# Patient Record
Sex: Female | Born: 1975 | Race: Black or African American | Hispanic: No | Marital: Married | Smoking: Former smoker
Health system: Southern US, Community
[De-identification: ages and names within clinical notes are randomized; demographics above are authoritative.]

## PROBLEM LIST (undated history)

## (undated) DIAGNOSIS — R55 Syncope and collapse: Secondary | ICD-10-CM

## (undated) DIAGNOSIS — F419 Anxiety disorder, unspecified: Secondary | ICD-10-CM

## (undated) DIAGNOSIS — L309 Dermatitis, unspecified: Secondary | ICD-10-CM

## (undated) DIAGNOSIS — D649 Anemia, unspecified: Secondary | ICD-10-CM

## (undated) DIAGNOSIS — I10 Essential (primary) hypertension: Secondary | ICD-10-CM

## (undated) HISTORY — DX: Dermatitis, unspecified: L30.9

## (undated) HISTORY — DX: Anxiety disorder, unspecified: F41.9

## (undated) HISTORY — PX: TUBAL LIGATION: SHX77

---

## 2004-06-05 ENCOUNTER — Emergency Department (HOSPITAL_COMMUNITY): Admission: EM | Admit: 2004-06-05 | Discharge: 2004-06-05 | Payer: Self-pay | Admitting: *Deleted

## 2005-08-31 ENCOUNTER — Observation Stay: Payer: Self-pay | Admitting: Obstetrics and Gynecology

## 2005-10-31 ENCOUNTER — Observation Stay: Payer: Self-pay | Admitting: Certified Nurse Midwife

## 2005-12-06 ENCOUNTER — Inpatient Hospital Stay: Payer: Self-pay | Admitting: Obstetrics and Gynecology

## 2010-05-20 ENCOUNTER — Emergency Department: Payer: Self-pay | Admitting: Emergency Medicine

## 2010-05-25 ENCOUNTER — Ambulatory Visit: Payer: Self-pay | Admitting: Cardiovascular Disease

## 2010-06-09 ENCOUNTER — Ambulatory Visit: Payer: Self-pay | Admitting: Cardiovascular Disease

## 2013-03-18 ENCOUNTER — Emergency Department: Payer: Self-pay

## 2013-03-18 LAB — MAGNESIUM: Magnesium: 1.8 mg/dL

## 2013-03-18 LAB — CBC
HCT: 31.6 % — ABNORMAL LOW (ref 35.0–47.0)
MCH: 22.9 pg — ABNORMAL LOW (ref 26.0–34.0)
MCHC: 31.8 g/dL — ABNORMAL LOW (ref 32.0–36.0)
MCV: 72 fL — ABNORMAL LOW (ref 80–100)
Platelet: 329 10*3/uL (ref 150–440)
RBC: 4.38 10*6/uL (ref 3.80–5.20)

## 2013-03-18 LAB — COMPREHENSIVE METABOLIC PANEL
Albumin: 3 g/dL — ABNORMAL LOW (ref 3.4–5.0)
Alkaline Phosphatase: 59 U/L
Anion Gap: 6 — ABNORMAL LOW (ref 7–16)
BUN: 6 mg/dL — ABNORMAL LOW (ref 7–18)
Chloride: 105 mmol/L (ref 98–107)
Co2: 27 mmol/L (ref 21–32)
Creatinine: 0.8 mg/dL (ref 0.60–1.30)
EGFR (Non-African Amer.): >60
SGOT(AST): 27 U/L (ref 15–37)
SGPT (ALT): 19 U/L (ref 12–78)
Sodium: 138 mmol/L (ref 136–145)
Total Protein: 7 g/dL (ref 6.4–8.2)

## 2013-03-18 LAB — LIPASE, BLOOD: Lipase: 66 U/L — ABNORMAL LOW (ref 73–393)

## 2014-01-23 DIAGNOSIS — R55 Syncope and collapse: Secondary | ICD-10-CM

## 2014-01-23 HISTORY — DX: Syncope and collapse: R55

## 2014-06-01 ENCOUNTER — Encounter (HOSPITAL_COMMUNITY): Payer: Self-pay | Admitting: Emergency Medicine

## 2014-06-01 ENCOUNTER — Emergency Department (HOSPITAL_COMMUNITY): Payer: Medicaid Other

## 2014-06-01 ENCOUNTER — Emergency Department (HOSPITAL_COMMUNITY)
Admission: EM | Admit: 2014-06-01 | Discharge: 2014-06-02 | Disposition: A | Discharge status: Home / Self Care | Payer: Medicaid Other | Attending: Emergency Medicine | Admitting: Emergency Medicine

## 2014-06-01 DIAGNOSIS — Z3202 Encounter for pregnancy test, result negative: Secondary | ICD-10-CM | POA: Diagnosis not present

## 2014-06-01 DIAGNOSIS — R102 Pelvic and perineal pain: Secondary | ICD-10-CM

## 2014-06-01 DIAGNOSIS — Z9851 Tubal ligation status: Secondary | ICD-10-CM | POA: Diagnosis not present

## 2014-06-01 DIAGNOSIS — R1032 Left lower quadrant pain: Secondary | ICD-10-CM

## 2014-06-01 DIAGNOSIS — N921 Excessive and frequent menstruation with irregular cycle: Secondary | ICD-10-CM

## 2014-06-01 DIAGNOSIS — I10 Essential (primary) hypertension: Secondary | ICD-10-CM | POA: Diagnosis not present

## 2014-06-01 DIAGNOSIS — N938 Other specified abnormal uterine and vaginal bleeding: Secondary | ICD-10-CM | POA: Diagnosis present

## 2014-06-01 DIAGNOSIS — N939 Abnormal uterine and vaginal bleeding, unspecified: Secondary | ICD-10-CM

## 2014-06-01 DIAGNOSIS — N920 Excessive and frequent menstruation with regular cycle: Secondary | ICD-10-CM | POA: Insufficient documentation

## 2014-06-01 HISTORY — DX: Essential (primary) hypertension: I10

## 2014-06-01 LAB — CBC WITH DIFFERENTIAL/PLATELET
Basophils Absolute: 0 10*3/uL (ref 0.0–0.1)
Basophils Relative: 0 % (ref 0–1)
EOS PCT: 2 % (ref 0–5)
Eosinophils Absolute: 0.2 10*3/uL (ref 0.0–0.7)
HCT: 25.7 % — ABNORMAL LOW (ref 36.0–46.0)
Hemoglobin: 7.7 g/dL — ABNORMAL LOW (ref 12.0–15.0)
LYMPHS ABS: 2.5 10*3/uL (ref 0.7–4.0)
Lymphocytes Relative: 24 % (ref 12–46)
MCH: 21.4 pg — AB (ref 26.0–34.0)
MCHC: 30 g/dL (ref 30.0–36.0)
MCV: 71.6 fL — ABNORMAL LOW (ref 78.0–100.0)
MONO ABS: 0.6 10*3/uL (ref 0.1–1.0)
MONOS PCT: 6 % (ref 3–12)
NEUTROS PCT: 68 % (ref 43–77)
Neutro Abs: 7 10*3/uL (ref 1.7–7.7)
Platelets: 383 10*3/uL (ref 150–400)
RBC: 3.59 MIL/uL — ABNORMAL LOW (ref 3.87–5.11)
RDW: 16.4 % — ABNORMAL HIGH (ref 11.5–15.5)
WBC: 10.3 10*3/uL (ref 4.0–10.5)

## 2014-06-01 LAB — COMPREHENSIVE METABOLIC PANEL
ALBUMIN: 3 g/dL — AB (ref 3.5–5.2)
ALK PHOS: 44 U/L (ref 39–117)
ALT: 12 U/L (ref 0–35)
ANION GAP: 4 — AB (ref 5–15)
AST: 20 U/L (ref 0–37)
BUN: 10 mg/dL (ref 6–23)
CALCIUM: 8.3 mg/dL — AB (ref 8.4–10.5)
CO2: 28 mmol/L (ref 19–32)
CREATININE: 1.01 mg/dL (ref 0.50–1.10)
Chloride: 105 mmol/L (ref 96–112)
GFR, EST AFRICAN AMERICAN: 80 mL/min — AB (ref >90)
GFR, EST NON AFRICAN AMERICAN: 69 mL/min — AB (ref >90)
Glucose, Bld: 140 mg/dL — ABNORMAL HIGH (ref 70–99)
POTASSIUM: 3.3 mmol/L — AB (ref 3.5–5.1)
SODIUM: 137 mmol/L (ref 135–145)
TOTAL PROTEIN: 7 g/dL (ref 6.0–8.3)
Total Bilirubin: 0.3 mg/dL (ref 0.3–1.2)

## 2014-06-01 LAB — I-STAT BETA HCG BLOOD, ED (MC, WL, AP ONLY)

## 2014-06-01 LAB — LIPASE, BLOOD: Lipase: 20 U/L (ref 11–59)

## 2014-06-01 MED ORDER — SODIUM CHLORIDE 0.9 % IV BOLUS (SEPSIS)
1000.0000 mL | Freq: Once | INTRAVENOUS | Status: AC
  Administered 2014-06-01: 1000 mL via INTRAVENOUS

## 2014-06-01 MED ORDER — ONDANSETRON HCL 4 MG PO TABS: 4.0000 mg | ORAL_TABLET | Freq: Four times a day (QID) | ORAL | Status: DC

## 2014-06-01 MED ORDER — HYDROMORPHONE HCL 1 MG/ML IJ SOLN
1.0000 mg | Freq: Once | INTRAMUSCULAR | Status: AC
  Administered 2014-06-01: 1 mg via INTRAVENOUS
  Filled 2014-06-01: qty 1

## 2014-06-01 MED ORDER — OXYCODONE-ACETAMINOPHEN 5-325 MG PO TABS: 1.0000 | ORAL_TABLET | Freq: Four times a day (QID) | ORAL | Status: DC | PRN

## 2014-06-01 MED ORDER — MEGESTROL ACETATE 40 MG PO TABS: 40.0000 mg | ORAL_TABLET | Freq: Every day | ORAL | Status: DC

## 2014-06-01 MED ORDER — ONDANSETRON HCL 4 MG/2ML IJ SOLN
4.0000 mg | Freq: Once | INTRAMUSCULAR | Status: AC
  Administered 2014-06-01: 4 mg via INTRAVENOUS
  Filled 2014-06-01: qty 2

## 2014-06-01 NOTE — ED Provider Notes (Signed)
CSN: [id number]     Arrival date & time 06/01/14  2054 History   First MD Initiated Contact with Patient 06/01/14 2106     Chief Complaint  Patient presents with  . Abdominal Pain  . Vaginal Bleeding     Patient is a 39 y.o. female presenting with abdominal pain and vaginal bleeding. The history is provided by the patient. No language interpreter was used.  Abdominal Pain Associated symptoms: vaginal bleeding   Vaginal Bleeding Associated symptoms: abdominal pain    Kathleen Rush presents for evaluation of abdominal pain and vomiting. She reports 2 weeks of left lower quadrant abdominal pain. The pain is described as sharp and constant nature. The pain is nonradiating. She reports it feels worse at night. She also reports 2 weeks of vomiting, emesis approximately 2 times per day. She has associated constipation, her last bowel movement was 2 days ago. She is passing gas without difficulty. She denies any fevers, chest pain, shortness of breath, leg swelling or pain, dysuria. She states she is having heavy menstrual cycle at this time and passing clots, which is unusual for her. Her last menstrual period was January 14. Symptoms are moderate, constant, worsening.  Past Medical History  Diagnosis Date  . Hypertension    Past Surgical History  Procedure Laterality Date  . Cesarean section    . Tubal ligation     No family history on file. History  Substance Use Topics  . Smoking status: Not on file  . Smokeless tobacco: Not on file  . Alcohol Use: Not on file   OB History    No data available     Review of Systems  Gastrointestinal: Positive for abdominal pain.  Genitourinary: Positive for vaginal bleeding.  All other systems reviewed and are negative.     Allergies  Review of patient's allergies indicates no known allergies.  Home Medications   Prior to Admission medications   Not on File   BP 149/64 mmHg  Pulse 72  Temp(Src) 97.6 F (36.4 C) (Oral)  Resp 18  Ht  5' (1.524 m)  Wt 220 lb (99.791 kg)  BMI 42.97 kg/m2  SpO2 100%  LMP 06/01/2014 (Exact Date) Physical Exam  Constitutional: She is oriented to person, place, and time. She appears well-developed and well-nourished.  HENT:  Head: Normocephalic and atraumatic.  Cardiovascular: Normal rate and regular rhythm.   No murmur heard. Pulmonary/Chest: Effort normal and breath sounds normal. No respiratory distress.  Abdominal: Soft. There is no rebound and no guarding.  Mild to moderate left mid abdominal tenderness without guarding or rebound  Genitourinary:  Os closed, moderate blood in vaginal vault, no clots.  No re-pooling of blood after clearing.    Musculoskeletal: She exhibits no edema or tenderness.  Neurological: She is alert and oriented to person, place, and time.  Skin: Skin is warm and dry.  Psychiatric: She has a normal mood and affect. Her behavior is normal.  Nursing note and vitals reviewed.   ED Course  Procedures (including critical care time) Labs Review Labs Reviewed  CBC WITH DIFFERENTIAL/PLATELET - Abnormal; Notable for the following:    RBC 3.59 (*)    Hemoglobin 7.7 (*)    HCT 25.7 (*)    MCV 71.6 (*)    MCH 21.4 (*)    RDW 16.4 (*)    All other components within normal limits  COMPREHENSIVE METABOLIC PANEL - Abnormal; Notable for the following:    Potassium 3.3 (*)  Glucose, Bld 140 (*)    Calcium 8.3 (*)    Albumin 3.0 (*)    GFR calc non Af Amer 69 (*)    GFR calc Af Amer 80 (*)    Anion gap 4 (*)    All other components within normal limits  LIPASE, BLOOD  URINALYSIS, ROUTINE W REFLEX MICROSCOPIC  POC URINE PREG, ED  I-STAT BETA HCG BLOOD, ED ([facility], [facility], [facility] ONLY)  GC/CHLAMYDIA PROBE AMP ([facility])    Imaging Review No results found.   EKG Interpretation None      MDM   Final diagnoses:  Menorrhagia with irregular cycle    Patient here for evaluation of vaginal bleeding. Patient does have some bleeding on exam, but by history this  is improving. There is no recurrent bleeding during exam. Patient is anemic on her labs. She states she has a history of anemia and is supposed to take iron supplements. It is unclear what her baseline hemoglobin as. During emergency Department evaluation patient has been hemodynamically stable. Discussed the case with OB/GYN on-call, plan to start Megace with close outpatient follow-up and return precautions. Pelvic ultrasound pending given left lower quadrant pain on history and exam. Presentation is not consistent with diverticulitis or bowel obstruction.    Tilden FossaElizabeth Grove Defina, MD 06/02/14 [phone]

## 2014-06-01 NOTE — Discharge Instructions (Signed)
It is very important to follow up with your OBGYN in the next day for recheck.  Restart your iron supplements.  Your Hemoglobin today was 7.7, which is very low.    Abnormal Uterine Bleeding Abnormal uterine bleeding can affect women at various stages in life, including teenagers, women in their reproductive years, pregnant women, and women who have reached menopause. Several kinds of uterine bleeding are considered abnormal, including:  Bleeding or spotting between periods.   Bleeding after sexual intercourse.   Bleeding that is heavier or more than normal.   Periods that last longer than usual.  Bleeding after menopause.  Many cases of abnormal uterine bleeding are minor and simple to treat, while others are more serious. Any type of abnormal bleeding should be evaluated by your health care provider. Treatment will depend on the cause of the bleeding. HOME CARE INSTRUCTIONS Monitor your condition for any changes. The following actions may help to alleviate any discomfort you are experiencing:  Avoid the use of tampons and douches as directed by your health care provider.  Change your pads frequently. You should get regular pelvic exams and Pap tests. Keep all follow-up appointments for diagnostic tests as directed by your health care provider.  SEEK MEDICAL CARE IF:   Your bleeding lasts more than 1 week.   You feel dizzy at times.  SEEK IMMEDIATE MEDICAL CARE IF:   You pass out.   You are changing pads every 15 to 30 minutes.   You have abdominal pain.  You have a fever.   You become sweaty or weak.   You are passing large blood clots from the vagina.   You start to feel nauseous and vomit. MAKE SURE YOU:   Understand these instructions.  Will watch your condition.  Will get help right away if you are not doing well or get worse. Document Released: 04/11/2005 Document Revised: 04/16/2013 Document Reviewed: 11/08/2012 [facility] Patient Information 2015  [employer] [place]. This information is not intended to replace advice given to you by your health care provider. Make sure you discuss any questions you have with your health care provider.

## 2014-06-01 NOTE — ED Notes (Signed)
Patient here with complaint of LLQ abdominal pain. States that it has been present for about 2 weeks. States that it is much worse when she eats eggs. States pain has been constant since eating eggs 2 weeks ago. States that she continues to eat eggs as recently as today. Vomiting occurs as well after consumption.

## 2014-06-01 NOTE — ED Notes (Signed)
Additionally reports vaginal bleeding with lots of clots being passed. States she feels like she may be pregnant, but has had tubal ligation.

## 2014-06-02 ENCOUNTER — Telehealth: Payer: Self-pay | Admitting: *Deleted

## 2014-06-02 LAB — GC/CHLAMYDIA PROBE AMP (~~LOC~~) NOT AT ARMC
CHLAMYDIA, DNA PROBE: NEGATIVE
NEISSERIA GONORRHEA: NEGATIVE

## 2014-06-02 MED ORDER — OXYCODONE-ACETAMINOPHEN 5-325 MG PO TABS
2.0000 | ORAL_TABLET | Freq: Once | ORAL | Status: AC
  Administered 2014-06-02: 2 via ORAL
  Filled 2014-06-02: qty 2

## 2014-06-02 NOTE — ED Provider Notes (Signed)
1:00 AM  Assumed care from Dr. Madilyn Hookees.  Pt is a 39 y.o. female who presents the emergency department with 2 weeks of left lower quadrant pain and heavy vaginal bleeding for the past 2-3 days. Bleeding his early improved. Hemoglobin is 7.7 but suspect this is chronic. No hemodynamic instability. Abdominal exam benign. Pelvic ultrasound was pending but is unremarkable. We'll discharge home with prescription for Percocet, Zofran as well as Megace taper. We'll give OB/GYN follow-up information. Patient appears very comfortable eating crackers and drinking ginger ale in the room. In no distress.  Kathleen MawKristen N Ward, DO 06/02/14 0104

## 2014-06-02 NOTE — Telephone Encounter (Signed)
Kathleen Rush called and left a message requesting results of std tests.  Per chart review [name] and is referred to our clinic 06/23/14. Called [name] and explained I can give her results of STD tests- GC/Chlam which were negative , but any other blood tests she will either need to wait and discuss with the doctor at her visit her 06[facility]23/14 or [name] since those are not our typical tests. I reviewed with her the appointmetn date /time and our location. She voices understanding.

## 2014-06-04 ENCOUNTER — Telehealth (HOSPITAL_BASED_OUTPATIENT_CLINIC_OR_DEPARTMENT_OTHER): Payer: Self-pay | Admitting: Emergency Medicine

## 2014-06-23 ENCOUNTER — Ambulatory Visit (INDEPENDENT_AMBULATORY_CARE_PROVIDER_SITE_OTHER): Payer: Medicaid Other | Admitting: Obstetrics & Gynecology

## 2014-06-23 ENCOUNTER — Encounter: Payer: Self-pay | Admitting: Obstetrics & Gynecology

## 2014-06-23 VITALS — BP 142/87 | HR 70 | Temp 97.8°F | Ht 63.25 in | Wt 224.5 lb

## 2014-06-23 DIAGNOSIS — N920 Excessive and frequent menstruation with regular cycle: Secondary | ICD-10-CM

## 2014-06-23 DIAGNOSIS — D638 Anemia in other chronic diseases classified elsewhere: Secondary | ICD-10-CM

## 2014-06-23 DIAGNOSIS — Z30430 Encounter for insertion of intrauterine contraceptive device: Secondary | ICD-10-CM

## 2014-06-23 LAB — POCT PREGNANCY, URINE: PREG TEST UR: NEGATIVE

## 2014-06-23 MED ORDER — IBUPROFEN 800 MG PO TABS: 800.0000 mg | ORAL_TABLET | Freq: Three times a day (TID) | ORAL | Status: DC | PRN

## 2014-06-23 MED ORDER — MEGESTROL ACETATE 40 MG PO TABS: 40.0000 mg | ORAL_TABLET | Freq: Every day | ORAL | Status: DC

## 2014-06-23 MED ORDER — LEVONORGESTREL 20 MCG/24HR IU IUD
INTRAUTERINE_SYSTEM | Freq: Once | INTRAUTERINE | Status: AC
  Administered 2014-06-23: 1 via INTRAUTERINE

## 2014-06-23 MED ORDER — VITAMIN C 500 MG PO TABS: 500.0000 mg | ORAL_TABLET | Freq: Every day | ORAL | Status: DC

## 2014-06-23 NOTE — Progress Notes (Signed)
Subjective:     Patient ID: Kathleen Rush, female   DOB: 1976-04-15, 39 y.o.   MRN: [id number]  HPI Pt c/o painful menses and heavy menses.  She denies intermenstral bleeding.  Her bleeding has led to anemia.   Review of Systems     Objective:   Physical Exam BP 142/87 mmHg  Pulse 70  Temp(Src) 97.8 F (36.6 C) (Oral)  Ht 5' 3.25" (1.607 m)  Wt 224 lb 8 oz (101.833 kg)  BMI 39.43 kg/m2  LMP 06/01/2014 (Exact Date)  GYNECOLOGY CLINIC PROCEDURE NOTE  IUD Insertion Procedure Note Patient identified, informed consent performed.  Discussed risks of irregular bleeding, cramping, infection, malpositioning or misplacement of the IUD outside the uterus which may require further procedures. Time out was performed.  Urine pregnancy test negative.  Speculum placed in the vagina.  Cervix visualized.  Cleaned with Betadine x 2.  Grasped anteriorly with a single tooth tenaculum.  Uterus sounded to 10 cm.  Mirena IUD placed per manufacturer's recommendations.  Strings trimmed to 3 cm. Tenaculum was removed, good hemostasis noted.  Patient tolerated procedure well.    CBC    Component Value Date/Time   WBC 10.3 06/01/2014 2109   RBC 3.59* 06/01/2014 2109   HGB 7.7* 06/01/2014 2109   HCT 25.7* 06/01/2014 2109   PLT 383 06/01/2014 2109   MCV 71.6* 06/01/2014 2109   MCH 21.4* 06/01/2014 2109   MCHC 30.0 06/01/2014 2109   RDW 16.4* 06/01/2014 2109   LYMPHSABS 2.5 06/01/2014 2109   MONOABS 0.6 06/01/2014 2109   EOSABS 0.2 06/01/2014 2109   BASOSABS 0.0 06/01/2014 2109       06/02/2014 CLINICAL DATA: Left lower quadrant pain, nausea, and vaginal bleeding.  EXAM: TRANSABDOMINAL AND TRANSVAGINAL ULTRASOUND OF PELVIS  TECHNIQUE: Both transabdominal and transvaginal ultrasound examinations of the pelvis were performed. Transabdominal technique was performed for global imaging of the pelvis including uterus, ovaries, adnexal regions, and pelvic cul-de-sac. It was necessary to proceed  with endovaginal exam following the transabdominal exam to visualize the ovaries and endometrium.  COMPARISON: None  FINDINGS: Uterus  Measurements: 12.7 x 5.7 x 6.6 cm, anteverted. No fibroids or other mass visualized.  Endometrium  Thickness: 9.5 mm. No focal abnormality visualized.  Right ovary  Measurements: 4.1 x 1.5 x 2 cm. Normal appearance/no adnexal mass.  Left ovary  Measurements: 3.3 x 1.8 x 2 cm. Normal appearance/no adnexal mass.  Other findings  Minimal free fluid in the pelvis.  IMPRESSION: Minimal free fluid in the pelvis is likely physiologic. Uterus and ovaries appear normal.  Assessment:     Menorrhagia and dysmenorrhea  Anemia    Plan:     S/p IUD placement F/u in 4 weeks CBC and TSH Motrin 800mg  q 8 hours prn pain  Patient was given post-procedure instructions.  Patient was also asked to check IUD strings periodically and follow up in 4 weeks for IUD check. FeSO4 bid + vit c 500 mig

## 2014-06-23 NOTE — Patient Instructions (Signed)
Levonorgestrel intrauterine device (IUD) What is this medicine? LEVONORGESTREL IUD (LEE voe nor jes trel) is a contraceptive (birth control) device. The device is placed inside the uterus by a healthcare professional. It is used to prevent pregnancy and can also be used to treat heavy bleeding that occurs during your period. Depending on the device, it can be used for 3 to 5 years. This medicine may be used for other purposes; ask your health care provider or pharmacist if you have questions. COMMON BRAND NAME(S): LILETTA, Mirena, Skyla What should I tell my health care provider before I take this medicine? They need to know if you have any of these conditions: -abnormal Pap smear -cancer of the breast, uterus, or cervix -diabetes -endometritis -genital or pelvic infection now or in the past -have more than one sexual partner or your partner has more than one partner -heart disease -history of an ectopic or tubal pregnancy -immune system problems -IUD in place -liver disease or tumor -problems with blood clots or take blood-thinners -use intravenous drugs -uterus of unusual shape -vaginal bleeding that has not been explained -an unusual or allergic reaction to levonorgestrel, other hormones, silicone, or polyethylene, medicines, foods, dyes, or preservatives -pregnant or trying to get pregnant -breast-feeding How should I use this medicine? This device is placed inside the uterus by a health care professional. Talk to your pediatrician regarding the use of this medicine in children. Special care may be needed. Overdosage: If you think you have taken too much of this medicine contact a poison control center or emergency room at once. NOTE: This medicine is only for you. Do not share this medicine with others. What if I miss a dose? This does not apply. What may interact with this medicine? Do not take this medicine with any of the following  medications: -amprenavir -bosentan -fosamprenavir This medicine may also interact with the following medications: -aprepitant -barbiturate medicines for inducing sleep or treating seizures -bexarotene -griseofulvin -medicines to treat seizures like carbamazepine, ethotoin, felbamate, oxcarbazepine, phenytoin, topiramate -modafinil -pioglitazone -rifabutin -rifampin -rifapentine -some medicines to treat HIV infection like atazanavir, indinavir, lopinavir, nelfinavir, tipranavir, ritonavir -St. John's wort -warfarin This list may not describe all possible interactions. Give your health care provider a list of all the medicines, herbs, non-prescription drugs, or dietary supplements you use. Also tell them if you smoke, drink alcohol, or use illegal drugs. Some items may interact with your medicine. What should I watch for while using this medicine? Visit your doctor or health care professional for regular check ups. See your doctor if you or your partner has sexual contact with others, becomes HIV positive, or gets a sexual transmitted disease. This product does not protect you against HIV infection (AIDS) or other sexually transmitted diseases. You can check the placement of the IUD yourself by reaching up to the top of your vagina with clean fingers to feel the threads. Do not pull on the threads. It is a good habit to check placement after each menstrual period. Call your doctor right away if you feel more of the IUD than just the threads or if you cannot feel the threads at all. The IUD may come out by itself. You may become pregnant if the device comes out. If you notice that the IUD has come out use a backup birth control method like condoms and call your health care provider. Using tampons will not change the position of the IUD and are okay to use during your period. What side effects may   I notice from receiving this medicine? Side effects that you should report to your doctor or  health care professional as soon as possible: -allergic reactions like skin rash, itching or hives, swelling of the face, lips, or tongue -fever, flu-like symptoms -genital sores -high blood pressure -no menstrual period for 6 weeks during use -pain, swelling, warmth in the leg -pelvic pain or tenderness -severe or sudden headache -signs of pregnancy -stomach cramping -sudden shortness of breath -trouble with balance, talking, or walking -unusual vaginal bleeding, discharge -yellowing of the eyes or skin Side effects that usually do not require medical attention (report to your doctor or health care professional if they continue or are bothersome): -acne -breast pain -change in sex drive or performance -changes in weight -cramping, dizziness, or faintness while the device is being inserted -headache -irregular menstrual bleeding within first 3 to 6 months of use -nausea This list may not describe all possible side effects. Call your doctor for medical advice about side effects. You may report side effects to FDA at 1-800-FDA-1088. Where should I keep my medicine? This does not apply. NOTE: This sheet is a summary. It may not cover all possible information. If you have questions about this medicine, talk to your doctor, pharmacist, or health care provider.  2015, Elsevier/Gold Standard. (2011-05-12 13:54:04)  

## 2014-06-24 ENCOUNTER — Telehealth: Payer: Self-pay | Admitting: *Deleted

## 2014-06-24 DIAGNOSIS — D638 Anemia in other chronic diseases classified elsewhere: Secondary | ICD-10-CM

## 2014-06-24 LAB — CBC
HCT: 32 % — ABNORMAL LOW (ref 36.0–46.0)
HEMOGLOBIN: 9.2 g/dL — AB (ref 12.0–15.0)
MCH: 21.7 pg — AB (ref 26.0–34.0)
MCHC: 28.8 g/dL — ABNORMAL LOW (ref 30.0–36.0)
MCV: 75.7 fL — AB (ref 78.0–100.0)
MPV: 9.8 fL (ref 8.6–12.4)
PLATELETS: 438 10*3/uL — AB (ref 150–400)
RBC: 4.23 MIL/uL (ref 3.87–5.11)
RDW: 22.4 % — ABNORMAL HIGH (ref 11.5–15.5)
WBC: 5.6 10*3/uL (ref 4.0–10.5)

## 2014-06-24 LAB — TSH: TSH: 1.408 u[IU]/mL (ref 0.350–4.500)

## 2014-06-24 MED ORDER — MEGESTROL ACETATE 40 MG PO TABS: ORAL_TABLET | ORAL | Status: DC

## 2014-06-24 NOTE — Telephone Encounter (Signed)
Called pt regarding Rx from yesterday which she did not receive while in the clinic. Message left for pt to call back and state whether a detailed message can be left on her voice mail. Pt needs to be informed that her Rx for Megace has been sent to her [facility] pharmacy. (Rx was printed yesterday instead of e-prescribed.)

## 2014-06-25 MED ORDER — FERROUS SULFATE 325 (65 FE) MG PO TABS: 325.0000 mg | ORAL_TABLET | Freq: Two times a day (BID) | ORAL | Status: DC

## 2014-06-25 NOTE — Telephone Encounter (Signed)
Called patient apologizing for inconvenience but her Rx for megace is at her pharmacy now. Patient verbalized understanding and states she is supposed to have a Rx for iron too. Told patient we will get that prescription sent to her pharmacy as well. Patient verbalized understanding and had no other questions

## 2014-06-30 ENCOUNTER — Encounter: Payer: Self-pay | Admitting: *Deleted

## 2014-07-21 ENCOUNTER — Ambulatory Visit (INDEPENDENT_AMBULATORY_CARE_PROVIDER_SITE_OTHER): Payer: Medicaid Other | Admitting: Obstetrics & Gynecology

## 2014-07-21 ENCOUNTER — Encounter: Payer: Self-pay | Admitting: Obstetrics & Gynecology

## 2014-07-21 VITALS — BP 162/92 | HR 71 | Temp 97.7°F | Ht 62.0 in | Wt 223.4 lb

## 2014-07-21 DIAGNOSIS — Z30431 Encounter for routine checking of intrauterine contraceptive device: Secondary | ICD-10-CM

## 2014-07-21 DIAGNOSIS — N920 Excessive and frequent menstruation with regular cycle: Secondary | ICD-10-CM

## 2014-07-21 NOTE — Patient Instructions (Signed)
Levonorgestrel intrauterine device (IUD) What is this medicine? LEVONORGESTREL IUD (LEE voe nor jes trel) is a contraceptive (birth control) device. The device is placed inside the uterus by a healthcare professional. It is used to prevent pregnancy and can also be used to treat heavy bleeding that occurs during your period. Depending on the device, it can be used for 3 to 5 years. This medicine may be used for other purposes; ask your health care provider or pharmacist if you have questions. COMMON BRAND NAME(S): LILETTA, Mirena, Skyla What should I tell my health care provider before I take this medicine? They need to know if you have any of these conditions: -abnormal Pap smear -cancer of the breast, uterus, or cervix -diabetes -endometritis -genital or pelvic infection now or in the past -have more than one sexual partner or your partner has more than one partner -heart disease -history of an ectopic or tubal pregnancy -immune system problems -IUD in place -liver disease or tumor -problems with blood clots or take blood-thinners -use intravenous drugs -uterus of unusual shape -vaginal bleeding that has not been explained -an unusual or allergic reaction to levonorgestrel, other hormones, silicone, or polyethylene, medicines, foods, dyes, or preservatives -pregnant or trying to get pregnant -breast-feeding How should I use this medicine? This device is placed inside the uterus by a health care professional. Talk to your pediatrician regarding the use of this medicine in children. Special care may be needed. Overdosage: If you think you have taken too much of this medicine contact a poison control center or emergency room at once. NOTE: This medicine is only for you. Do not share this medicine with others. What if I miss a dose? This does not apply. What may interact with this medicine? Do not take this medicine with any of the following  medications: -amprenavir -bosentan -fosamprenavir This medicine may also interact with the following medications: -aprepitant -barbiturate medicines for inducing sleep or treating seizures -bexarotene -griseofulvin -medicines to treat seizures like carbamazepine, ethotoin, felbamate, oxcarbazepine, phenytoin, topiramate -modafinil -pioglitazone -rifabutin -rifampin -rifapentine -some medicines to treat HIV infection like atazanavir, indinavir, lopinavir, nelfinavir, tipranavir, ritonavir -St. John's wort -warfarin This list may not describe all possible interactions. Give your health care provider a list of all the medicines, herbs, non-prescription drugs, or dietary supplements you use. Also tell them if you smoke, drink alcohol, or use illegal drugs. Some items may interact with your medicine. What should I watch for while using this medicine? Visit your doctor or health care professional for regular check ups. See your doctor if you or your partner has sexual contact with others, becomes HIV positive, or gets a sexual transmitted disease. This product does not protect you against HIV infection (AIDS) or other sexually transmitted diseases. You can check the placement of the IUD yourself by reaching up to the top of your vagina with clean fingers to feel the threads. Do not pull on the threads. It is a good habit to check placement after each menstrual period. Call your doctor right away if you feel more of the IUD than just the threads or if you cannot feel the threads at all. The IUD may come out by itself. You may become pregnant if the device comes out. If you notice that the IUD has come out use a backup birth control method like condoms and call your health care provider. Using tampons will not change the position of the IUD and are okay to use during your period. What side effects may   I notice from receiving this medicine? Side effects that you should report to your doctor or  health care professional as soon as possible: -allergic reactions like skin rash, itching or hives, swelling of the face, lips, or tongue -fever, flu-like symptoms -genital sores -high blood pressure -no menstrual period for 6 weeks during use -pain, swelling, warmth in the leg -pelvic pain or tenderness -severe or sudden headache -signs of pregnancy -stomach cramping -sudden shortness of breath -trouble with balance, talking, or walking -unusual vaginal bleeding, discharge -yellowing of the eyes or skin Side effects that usually do not require medical attention (report to your doctor or health care professional if they continue or are bothersome): -acne -breast pain -change in sex drive or performance -changes in weight -cramping, dizziness, or faintness while the device is being inserted -headache -irregular menstrual bleeding within first 3 to 6 months of use -nausea This list may not describe all possible side effects. Call your doctor for medical advice about side effects. You may report side effects to FDA at 1-800-FDA-1088. Where should I keep my medicine? This does not apply. NOTE: This sheet is a summary. It may not cover all possible information. If you have questions about this medicine, talk to your doctor, pharmacist, or health care provider.  2015, Elsevier/Gold Standard. (2011-05-12 13:54:04)  

## 2014-07-21 NOTE — Progress Notes (Signed)
Patient ID: Kathleen Rush, female   DOB: 27-Oct-1975, 39 y.o.   MRN: [id number] Pt states she is not having an issues with IUD, but her partner feels the IUD during intercourse.

## 2014-07-21 NOTE — Progress Notes (Signed)
Patient ID: Kathleen Rush, female   DOB: 25-Nov-1975, 39 y.o.   MRN: [id number] History:  39 y.o. [id number] here today for today for IUD string check; Mirena IUD was placed 1 month prev. No complaints about the Mirena, no concerning side effects.  Pt reports that her partner reports feeling the strings but she doesn't want it altered in any way.  She reports that he can 'just switch sides'  The following portions of the patient's history were reviewed and updated as appropriate: allergies, current medications, past family history, past medical history, past social history, past surgical history and problem list. Last pap smear was almost 3 years prev.  Review of Systems:  Pertinent items are noted in HPI.  Objective:  Physical Exam Blood pressure 156/85, pulse 71, temperature 97.7 F (36.5 C), height 5\' 2"  (1.575 m), weight 223 lb 6.4 oz (101.334 kg). Gen: NAD Abd: Soft, nontender and nondistended Pelvic: Normal appearing external genitalia; normal appearing vaginal mucosa and cervix.  IUD strings visualized, about 3cm in length outside cervix.  Did not appear to need trimming   Assessment & Plan:  Normal IUD check. Patient to keep IUD in place for five years; can come in for removal if she desires pregnancy within the next five years. Routine preventative health maintenance measures emphasized. CBC today Elevated BP- pt has appt with primary care later this week

## 2014-07-22 ENCOUNTER — Telehealth: Payer: Self-pay

## 2014-07-22 DIAGNOSIS — D638 Anemia in other chronic diseases classified elsewhere: Secondary | ICD-10-CM

## 2014-07-22 LAB — CBC
HEMATOCRIT: 38.8 % (ref 36.0–46.0)
Hemoglobin: 11.9 g/dL — ABNORMAL LOW (ref 12.0–15.0)
MCH: 23.8 pg — AB (ref 26.0–34.0)
MCHC: 30.7 g/dL (ref 30.0–36.0)
MCV: 77.4 fL — ABNORMAL LOW (ref 78.0–100.0)
MPV: 10.1 fL (ref 8.6–12.4)
Platelets: 314 10*3/uL (ref 150–400)
RBC: 5.01 MIL/uL (ref 3.87–5.11)
RDW: 23.5 % — ABNORMAL HIGH (ref 11.5–15.5)
WBC: 5.5 10*3/uL (ref 4.0–10.5)

## 2014-07-22 MED ORDER — FERROUS SULFATE 325 (65 FE) MG PO TABS: 325.0000 mg | ORAL_TABLET | Freq: Two times a day (BID) | ORAL | Status: DC

## 2014-07-22 NOTE — Telephone Encounter (Signed)
Pt called wanting refill on iron tablets. Per Dr. Macon LargeAnyanwu pt can have refill on iron tablets.  Called pt and informed her that her refill for iron tablets has been sent to [facility].  Pt stated thank you.

## 2014-07-23 ENCOUNTER — Telehealth: Payer: Self-pay | Admitting: *Deleted

## 2014-07-23 NOTE — Telephone Encounter (Signed)
Contacted patient, results given and recommendation from Dr. Erin FullingHarraway Smith.  Pt verbalizes understanding.

## 2014-07-23 NOTE — Telephone Encounter (Signed)
-----   Message from Willodean Rosenthalarolyn Harraway-Smith, MD sent at 07/23/2014 12:32 PM EDT ----- Please  Call pt.  She is no longer anemic.  She can stop FeSO4 an djust take a multivit with  FeSO4 daily.  Thx, clh-S

## 2014-08-23 ENCOUNTER — Emergency Department (HOSPITAL_COMMUNITY): Payer: Medicaid Other

## 2014-08-23 ENCOUNTER — Emergency Department (HOSPITAL_COMMUNITY)
Admission: EM | Admit: 2014-08-23 | Discharge: 2014-08-23 | Disposition: A | Discharge status: Home / Self Care | Payer: Medicaid Other | Attending: Emergency Medicine | Admitting: Emergency Medicine

## 2014-08-23 ENCOUNTER — Encounter (HOSPITAL_COMMUNITY): Payer: Self-pay | Admitting: Emergency Medicine

## 2014-08-23 DIAGNOSIS — Z79899 Other long term (current) drug therapy: Secondary | ICD-10-CM | POA: Insufficient documentation

## 2014-08-23 DIAGNOSIS — R200 Anesthesia of skin: Secondary | ICD-10-CM | POA: Diagnosis present

## 2014-08-23 DIAGNOSIS — R079 Chest pain, unspecified: Secondary | ICD-10-CM | POA: Insufficient documentation

## 2014-08-23 DIAGNOSIS — R202 Paresthesia of skin: Secondary | ICD-10-CM

## 2014-08-23 DIAGNOSIS — I10 Essential (primary) hypertension: Secondary | ICD-10-CM | POA: Diagnosis not present

## 2014-08-23 LAB — COMPREHENSIVE METABOLIC PANEL
ALK PHOS: 56 U/L (ref 39–117)
ALT: 16 U/L (ref 0–35)
AST: 23 U/L (ref 0–37)
Albumin: 3.6 g/dL (ref 3.5–5.2)
Anion gap: 8 (ref 5–15)
BUN: 9 mg/dL (ref 6–23)
CALCIUM: 9 mg/dL (ref 8.4–10.5)
CO2: 29 mmol/L (ref 19–32)
Chloride: 103 mmol/L (ref 96–112)
Creatinine, Ser: 0.89 mg/dL (ref 0.50–1.10)
GFR calc non Af Amer: 81 mL/min — ABNORMAL LOW (ref >90)
GLUCOSE: 89 mg/dL (ref 70–99)
Potassium: 3.7 mmol/L (ref 3.5–5.1)
SODIUM: 140 mmol/L (ref 135–145)
TOTAL PROTEIN: 7.4 g/dL (ref 6.0–8.3)
Total Bilirubin: 0.6 mg/dL (ref 0.3–1.2)

## 2014-08-23 LAB — CBC WITH DIFFERENTIAL/PLATELET
Basophils Absolute: 0 10*3/uL (ref 0.0–0.1)
Basophils Relative: 0 % (ref 0–1)
EOS ABS: 0.1 10*3/uL (ref 0.0–0.7)
EOS PCT: 2 % (ref 0–5)
HEMATOCRIT: 39.4 % (ref 36.0–46.0)
Hemoglobin: 12.4 g/dL (ref 12.0–15.0)
Lymphocytes Relative: 27 % (ref 12–46)
Lymphs Abs: 1.7 10*3/uL (ref 0.7–4.0)
MCH: 24.9 pg — AB (ref 26.0–34.0)
MCHC: 31.5 g/dL (ref 30.0–36.0)
MCV: 79.3 fL (ref 78.0–100.0)
MONO ABS: 0.3 10*3/uL (ref 0.1–1.0)
Monocytes Relative: 6 % (ref 3–12)
Neutro Abs: 4 10*3/uL (ref 1.7–7.7)
Neutrophils Relative %: 65 % (ref 43–77)
PLATELETS: 256 10*3/uL (ref 150–400)
RBC: 4.97 MIL/uL (ref 3.87–5.11)
RDW: 19.4 % — AB (ref 11.5–15.5)
WBC: 6.1 10*3/uL (ref 4.0–10.5)

## 2014-08-23 LAB — TROPONIN I: Troponin I: <0.03 ng/mL (ref <0.031)

## 2014-08-23 MED ORDER — OXYCODONE-ACETAMINOPHEN 5-325 MG PO TABS
1.0000 | ORAL_TABLET | Freq: Once | ORAL | Status: AC
  Administered 2014-08-23: 1 via ORAL
  Filled 2014-08-23: qty 1

## 2014-08-23 MED ORDER — KETOROLAC TROMETHAMINE 30 MG/ML IJ SOLN: 30.0000 mg | Freq: Once | INTRAMUSCULAR | Status: DC

## 2014-08-23 MED ORDER — KETOROLAC TROMETHAMINE 30 MG/ML IJ SOLN
30.0000 mg | Freq: Once | INTRAMUSCULAR | Status: AC
  Administered 2014-08-23: 30 mg via INTRAMUSCULAR
  Filled 2014-08-23: qty 1

## 2014-08-23 MED ORDER — PREGABALIN 50 MG PO CAPS: 50.0000 mg | ORAL_CAPSULE | Freq: Three times a day (TID) | ORAL | Status: DC

## 2014-08-23 NOTE — ED Notes (Signed)
Pt c/o numbness to hands x 1 1/2 months progressing to left side last night. Pt is a CNA and lifts patients.

## 2014-08-23 NOTE — ED Provider Notes (Signed)
CSN: [id number]     Arrival date & time 08/23/14  1123 History   First MD Initiated Contact with Patient 08/23/14 1235     Chief Complaint  Patient presents with  . Numbness     (Consider location/radiation/quality/duration/timing/severity/associated sxs/prior Treatment) The history is provided by the patient.   patient has had pain and numbness in her hands over the last few weeks. Worse since yesterday. States it depends which side she sleeps on that side tends to get worse. States last night it was on her back and both arms hurt. She states from her left hand up to her forearm. Also states she has some the same symptoms down her left leg. Occasional chest pain. No trouble breathing. No headache. No neck pain. No confusion. States her blood pressure is high because she is hurting. She is on blood pressure medicines.  Past Medical History  Diagnosis Date  . Hypertension    Past Surgical History  Procedure Laterality Date  . Cesarean section    . Tubal ligation     Family History  Problem Relation Age of Onset  . Diabetes Paternal Grandfather   . Diabetes Paternal Grandmother   . Diabetes Maternal Grandmother   . Diabetes Maternal Grandfather   . Hypertension Father   . Hypertension Mother    History  Substance Use Topics  . Smoking status: Never Smoker   . Smokeless tobacco: Never Used  . Alcohol Use: No   OB History    Gravida Para Term Preterm AB TAB SAB Ectopic Multiple Living   4 2 2  2 2    2      Review of Systems  Constitutional: Negative for activity change and appetite change.  Eyes: Negative for pain.  Respiratory: Negative for chest tightness and shortness of breath.   Cardiovascular: Positive for chest pain. Negative for leg swelling.  Gastrointestinal: Negative for nausea, vomiting, abdominal pain and diarrhea.  Genitourinary: Negative for flank pain.  Musculoskeletal: Negative for back pain and neck stiffness.  Skin: Negative for rash.  Neurological:  Positive for numbness. Negative for weakness and headaches.  Psychiatric/Behavioral: Negative for behavioral problems.      Allergies  Review of patient's allergies indicates no known allergies.  Home Medications   Prior to Admission medications   Medication Sig Start Date End Date Taking? Authorizing Provider  amLODipine (NORVASC) 5 MG tablet Take 5 mg by mouth every morning.  07/23/14  Yes Historical Provider, MD  FLUoxetine (PROZAC) 20 MG capsule Take 1 capsule by mouth every morning.  07/09/14  Yes Historical Provider, MD  ibuprofen (ADVIL,MOTRIN) 800 MG tablet Take 1 tablet (800 mg total) by mouth every 8 (eight) hours as needed. Patient taking differently: Take 800 mg by mouth every 8 (eight) hours as needed for mild pain.  06/23/14  Yes Willodean Rosenthalarolyn Harraway-Smith, MD  levonorgestrel (MIRENA) 20 MCG/24HR IUD 1 each by Intrauterine route once.   Yes Historical Provider, MD  lisinopril-hydrochlorothiazide (PRINZIDE,ZESTORETIC) 20-12.5 MG per tablet Take 2 tablets by mouth every morning.  07/09/14  Yes Historical Provider, MD  Multiple Vitamin (MULTIVITAMIN WITH MINERALS) TABS tablet Take 1 tablet by mouth daily.   Yes Historical Provider, MD  naproxen (NAPROSYN) 500 MG tablet Take 500 mg by mouth every morning.  07/23/14  Yes Historical Provider, MD  triamcinolone ointment (KENALOG) 0.1 % Apply 1 application topically every morning. Apply for eczema 06/05/14  Yes Historical Provider, MD  vitamin C (ASCORBIC ACID) 500 MG tablet Take 1 tablet (500 mg total)  by mouth daily. 06/23/14  Yes Willodean Rosenthal, MD  ferrous sulfate 325 (65 FE) MG tablet Take 1 tablet (325 mg total) by mouth 2 (two) times daily with a meal. Patient not taking: Reported on 08/23/2014 07/22/14   Tereso Newcomer, MD  megestrol (MEGACE) 40 MG tablet Take 1 tablet by mouth 3 times daily until bleeding stops, then take 1 tablet twice daily for 1 week, then take 1 tablet daily Patient not taking: Reported on 08/23/2014 06/24/14    Willodean Rosenthal, MD  ondansetron (ZOFRAN) 4 MG tablet Take 1 tablet (4 mg total) by mouth every 6 (six) hours. Patient not taking: Reported on 06/23/2014 06/01/14   Tilden Fossa, MD  oxyCODONE-acetaminophen (PERCOCET/ROXICET) 5-325 MG per tablet Take 1 tablet by mouth every 6 (six) hours as needed for moderate pain or severe pain. Patient not taking: Reported on 08/23/2014 06/01/14   Tilden Fossa, MD  pregabalin (LYRICA) 50 MG capsule Take 1 capsule (50 mg total) by mouth 3 (three) times daily. 08/23/14   Benjiman Core, MD   BP 162/74 mmHg  Pulse 64  Temp(Src) 98 F (36.7 C) (Oral)  Resp 15  Ht 5' (1.524 m)  Wt 220 lb (99.791 kg)  BMI 42.97 kg/m2  SpO2 99%  LMP  Physical Exam  Constitutional: She is oriented to person, place, and time. She appears well-developed and well-nourished.  HENT:  Head: Normocephalic and atraumatic.  Eyes: EOM are normal. Pupils are equal, round, and reactive to light.  Neck: Normal range of motion. Neck supple.  Cardiovascular: Normal rate, regular rhythm and normal heart sounds.   No murmur heard. Pulmonary/Chest: Effort normal and breath sounds normal. No respiratory distress. She has no wheezes. She has no rales.  Abdominal: Soft. Bowel sounds are normal. She exhibits no distension. There is no tenderness. There is no rebound and no guarding.  Musculoskeletal: Normal range of motion.  No cervical tenderness.  Neurological: She is alert and oriented to person, place, and time. No cranial nerve deficit.  Paresthesias to bilatera hands over whole distribution. Good strenghth. Similar change to left lower extremity. Good strength in both feet. Strong pulses in all extremities.   Skin: Skin is warm and dry.  Psychiatric: She has a normal mood and affect. Her speech is normal.  Nursing note and vitals reviewed.   ED Course  Procedures (including critical care time) Labs Review Labs Reviewed  CBC WITH DIFFERENTIAL/PLATELET - Abnormal; Notable for  the following:    MCH 24.9 (*)    RDW 19.4 (*)    All other components within normal limits  COMPREHENSIVE METABOLIC PANEL - Abnormal; Notable for the following:    GFR calc non Af Amer 81 (*)    All other components within normal limits  TROPONIN I    Imaging Review Dg Chest 2 View  08/23/2014   CLINICAL DATA:  Numbness and tingling to both hands, hypertension  EXAM: CHEST - 2 VIEW  COMPARISON:  06/05/2004  FINDINGS: Stable mild cardiomegaly.  Lungs are clear.  No pneumothorax. No effusion. Visualized skeletal structures are unremarkable.  IMPRESSION: 1. Stable mild cardiomegaly.  No acute disease.   Electronically Signed   By: Corlis Leak M.D.   On: 08/23/2014 13:59     EKG Interpretation None      MDM   Final diagnoses:  Paresthesias    Patient with pain/tingling in her hands and left leg. May be a neuropathy.  Reassuring. His hypertensive but that has improved. Will have follow-up with  neurology and started on Lyrica. Doubt intracranial hemorrhage or abnormality doubt severe cervical injury or occlusion she has no neck pain.    Benjiman Core, MD 08/23/14 [id number]

## 2014-08-23 NOTE — Discharge Instructions (Signed)

## 2014-09-24 ENCOUNTER — Other Ambulatory Visit: Payer: Self-pay

## 2014-09-24 DIAGNOSIS — N920 Excessive and frequent menstruation with regular cycle: Secondary | ICD-10-CM

## 2014-09-24 DIAGNOSIS — D638 Anemia in other chronic diseases classified elsewhere: Secondary | ICD-10-CM

## 2014-09-24 NOTE — Telephone Encounter (Signed)
Pt would like a refill on Ibuprofen 800 mg

## 2014-09-24 NOTE — Telephone Encounter (Signed)
Pt. Notified.

## 2014-10-10 ENCOUNTER — Encounter: Payer: Self-pay | Admitting: *Deleted

## 2014-10-10 ENCOUNTER — Other Ambulatory Visit: Payer: Medicaid Other

## 2014-10-10 DIAGNOSIS — Z79899 Other long term (current) drug therapy: Secondary | ICD-10-CM | POA: Diagnosis not present

## 2014-10-10 DIAGNOSIS — Z791 Long term (current) use of non-steroidal anti-inflammatories (NSAID): Secondary | ICD-10-CM | POA: Diagnosis not present

## 2014-10-10 DIAGNOSIS — Z833 Family history of diabetes mellitus: Secondary | ICD-10-CM | POA: Diagnosis not present

## 2014-10-10 DIAGNOSIS — Z809 Family history of malignant neoplasm, unspecified: Secondary | ICD-10-CM | POA: Diagnosis not present

## 2014-10-10 DIAGNOSIS — Z8249 Family history of ischemic heart disease and other diseases of the circulatory system: Secondary | ICD-10-CM | POA: Diagnosis not present

## 2014-10-10 DIAGNOSIS — Z87891 Personal history of nicotine dependence: Secondary | ICD-10-CM | POA: Diagnosis not present

## 2014-10-10 DIAGNOSIS — I10 Essential (primary) hypertension: Secondary | ICD-10-CM | POA: Diagnosis not present

## 2014-10-10 DIAGNOSIS — G5601 Carpal tunnel syndrome, right upper limb: Secondary | ICD-10-CM | POA: Diagnosis not present

## 2014-10-10 DIAGNOSIS — F419 Anxiety disorder, unspecified: Secondary | ICD-10-CM | POA: Diagnosis not present

## 2014-10-10 DIAGNOSIS — Z8261 Family history of arthritis: Secondary | ICD-10-CM | POA: Diagnosis not present

## 2014-10-10 DIAGNOSIS — G5602 Carpal tunnel syndrome, left upper limb: Secondary | ICD-10-CM | POA: Diagnosis present

## 2014-10-10 DIAGNOSIS — F329 Major depressive disorder, single episode, unspecified: Secondary | ICD-10-CM | POA: Diagnosis not present

## 2014-10-10 DIAGNOSIS — Z793 Long term (current) use of hormonal contraceptives: Secondary | ICD-10-CM | POA: Diagnosis not present

## 2014-10-10 NOTE — Patient Instructions (Signed)
  Your procedure is scheduled on: 10-14-14 Report to [facility] SAME [facility] 2ND FLOOR To find out your arrival time please call [phone] between 1PM - 3PM on 10-13-14  Remember: Instructions that are not followed completely may result in serious medical risk, up to and including death, or upon the discretion of your surgeon and anesthesiologist your surgery may need to be rescheduled.    _X___ 1. Do not eat food or drink liquids after midnight. No gum chewing or hard candies.     _X___ 2. No Alcohol for 24 hours before or after surgery.   ____ 3. Bring all medications with you on the day of surgery if instructed.    _X___ 4. Notify your doctor if there is any change in your medical condition     (cold, fever, infections).     Do not wear jewelry, make-up, hairpins, clips or nail polish.  Do not wear lotions, powders, or perfumes. You may wear deodorant.  Do not shave 48 hours prior to surgery. Men may shave face and neck.  Do not bring valuables to the hospital.    [facility] is not responsible for any belongings or valuables.               Contacts, dentures or bridgework may not be worn into surgery.  Leave your suitcase in the car. After surgery it may be brought to your room.  For patients admitted to the hospital, discharge time is determined by your  treatment team.   Patients discharged the day of surgery will not be allowed to drive home.   Please read over the following fact sheets that you were given:      ____ Take these medicines the morning of surgery with A SIP OF WATER:    1. BRING AMLODIPINE TO HOSPITAL   2.   3.   4.  5.  6.  ____ Fleet Enema (as directed)   _X___ Use CHG Soap as directed  ____ Use inhalers on the day of surgery  ____ Stop metformin 2 days prior to surgery    ____ Take 1/2 of usual insulin dose the night before surgery and none on the morning of surgery.   ____ Stop Coumadin/Plavix/aspirin-N/A  _X___ Stop  Anti-inflammatories-NO NSAIDS OR ASPIRIN PRODUCTS-TYLENOL OK   _X___ Stop supplements until after surgery-STOP VITAMIN C NOW  ____ Bring C-Pap to the hospital.

## 2014-10-13 ENCOUNTER — Encounter
Admission: RE | Admit: 2014-10-13 | Discharge: 2014-10-13 | Disposition: A | Discharge status: Home / Self Care | Payer: Medicaid Other | Source: Ambulatory Visit | Attending: Anesthesiology | Admitting: Anesthesiology

## 2014-10-13 DIAGNOSIS — G5601 Carpal tunnel syndrome, right upper limb: Secondary | ICD-10-CM | POA: Diagnosis not present

## 2014-10-13 DIAGNOSIS — I10 Essential (primary) hypertension: Secondary | ICD-10-CM | POA: Insufficient documentation

## 2014-10-13 DIAGNOSIS — F419 Anxiety disorder, unspecified: Secondary | ICD-10-CM | POA: Insufficient documentation

## 2014-10-13 LAB — POTASSIUM: Potassium: 3.2 mmol/L — ABNORMAL LOW (ref 3.5–5.1)

## 2014-10-14 ENCOUNTER — Ambulatory Visit
Admission: RE | Admit: 2014-10-14 | Discharge: 2014-10-14 | Disposition: A | Discharge status: Home / Self Care | Payer: Medicaid Other | Source: Ambulatory Visit | Attending: Specialist | Admitting: Specialist

## 2014-10-14 ENCOUNTER — Ambulatory Visit: Payer: Medicaid Other | Admitting: Anesthesiology

## 2014-10-14 ENCOUNTER — Encounter
Admission: RE | Disposition: A | Discharge status: Home / Self Care | Payer: Self-pay | Source: Ambulatory Visit | Attending: Specialist

## 2014-10-14 ENCOUNTER — Encounter: Payer: Self-pay | Admitting: *Deleted

## 2014-10-14 DIAGNOSIS — F419 Anxiety disorder, unspecified: Secondary | ICD-10-CM | POA: Insufficient documentation

## 2014-10-14 DIAGNOSIS — G5602 Carpal tunnel syndrome, left upper limb: Secondary | ICD-10-CM | POA: Insufficient documentation

## 2014-10-14 DIAGNOSIS — Z87891 Personal history of nicotine dependence: Secondary | ICD-10-CM | POA: Insufficient documentation

## 2014-10-14 DIAGNOSIS — Z809 Family history of malignant neoplasm, unspecified: Secondary | ICD-10-CM | POA: Insufficient documentation

## 2014-10-14 DIAGNOSIS — F329 Major depressive disorder, single episode, unspecified: Secondary | ICD-10-CM | POA: Insufficient documentation

## 2014-10-14 DIAGNOSIS — Z79899 Other long term (current) drug therapy: Secondary | ICD-10-CM | POA: Insufficient documentation

## 2014-10-14 DIAGNOSIS — G5601 Carpal tunnel syndrome, right upper limb: Secondary | ICD-10-CM | POA: Insufficient documentation

## 2014-10-14 DIAGNOSIS — Z8249 Family history of ischemic heart disease and other diseases of the circulatory system: Secondary | ICD-10-CM | POA: Insufficient documentation

## 2014-10-14 DIAGNOSIS — Z791 Long term (current) use of non-steroidal anti-inflammatories (NSAID): Secondary | ICD-10-CM | POA: Insufficient documentation

## 2014-10-14 DIAGNOSIS — I10 Essential (primary) hypertension: Secondary | ICD-10-CM | POA: Insufficient documentation

## 2014-10-14 DIAGNOSIS — Z833 Family history of diabetes mellitus: Secondary | ICD-10-CM | POA: Insufficient documentation

## 2014-10-14 DIAGNOSIS — Z8261 Family history of arthritis: Secondary | ICD-10-CM | POA: Insufficient documentation

## 2014-10-14 DIAGNOSIS — Z793 Long term (current) use of hormonal contraceptives: Secondary | ICD-10-CM | POA: Insufficient documentation

## 2014-10-14 HISTORY — PX: BILATERAL CARPAL TUNNEL RELEASE: SHX6508

## 2014-10-14 HISTORY — DX: Syncope and collapse: R55

## 2014-10-14 HISTORY — DX: Anemia, unspecified: D64.9

## 2014-10-14 LAB — POCT I-STAT 4, (NA,K, GLUC, HGB,HCT) (ARMC MAN. ENTRY): POTASSIUM: 3.6 mmol/L (ref 3.4–5.3)

## 2014-10-14 LAB — POCT PREGNANCY, URINE: Preg Test, Ur: NEGATIVE

## 2014-10-14 SURGERY — BILATERAL CARPAL TUNNEL RELEASE
Anesthesia: General | Laterality: Bilateral | Wound class: Clean

## 2014-10-14 MED ORDER — FENTANYL CITRATE (PF) 100 MCG/2ML IJ SOLN
25.0000 ug | INTRAMUSCULAR | Status: DC | PRN
  Administered 2014-10-14 (×2): 25 ug via INTRAVENOUS

## 2014-10-14 MED ORDER — OXYCODONE-ACETAMINOPHEN 7.5-325 MG PO TABS
ORAL_TABLET | ORAL | Status: AC
  Administered 2014-10-14: 1 via ORAL
  Filled 2014-10-14: qty 1

## 2014-10-14 MED ORDER — ONDANSETRON HCL 4 MG/2ML IJ SOLN
INTRAMUSCULAR | Status: DC | PRN
Start: 2014-10-14 — End: 2014-10-14
  Administered 2014-10-14: 4 mg via INTRAVENOUS

## 2014-10-14 MED ORDER — FAMOTIDINE 20 MG PO TABS
ORAL_TABLET | ORAL | Status: AC
  Administered 2014-10-14: 20 mg via ORAL
  Filled 2014-10-14: qty 1

## 2014-10-14 MED ORDER — OXYCODONE-ACETAMINOPHEN 7.5-325 MG PO TABS: 1.0000 | ORAL_TABLET | ORAL | Status: DC | PRN

## 2014-10-14 MED ORDER — PROPOFOL 10 MG/ML IV BOLUS
INTRAVENOUS | Status: DC | PRN
  Administered 2014-10-14: 30 mg via INTRAVENOUS
  Administered 2014-10-14: 50 mg via INTRAVENOUS
  Administered 2014-10-14: 200 mg via INTRAVENOUS

## 2014-10-14 MED ORDER — BUPIVACAINE HCL (PF) 0.5 % IJ SOLN
INTRAMUSCULAR | Status: AC
  Filled 2014-10-14: qty 30

## 2014-10-14 MED ORDER — CHLORHEXIDINE GLUCONATE 4 % EX LIQD: Freq: Once | CUTANEOUS | Status: DC

## 2014-10-14 MED ORDER — BUPIVACAINE HCL 0.5 % IJ SOLN
INTRAMUSCULAR | Status: DC | PRN
  Administered 2014-10-14 (×2): 5 mL

## 2014-10-14 MED ORDER — FENTANYL CITRATE (PF) 100 MCG/2ML IJ SOLN
INTRAMUSCULAR | Status: AC
  Administered 2014-10-14: 25 ug via INTRAVENOUS
  Filled 2014-10-14: qty 2

## 2014-10-14 MED ORDER — ONDANSETRON HCL 4 MG/2ML IJ SOLN: 4.0000 mg | Freq: Once | INTRAMUSCULAR | Status: DC | PRN

## 2014-10-14 MED ORDER — SODIUM CHLORIDE 0.9 % IR SOLN
Status: DC | PRN
  Administered 2014-10-14 (×2): 100 mL

## 2014-10-14 MED ORDER — LIDOCAINE HCL (CARDIAC) 20 MG/ML IV SOLN
INTRAVENOUS | Status: DC | PRN
  Administered 2014-10-14: 100 mg via INTRAVENOUS

## 2014-10-14 MED ORDER — LACTATED RINGERS IV SOLN
INTRAVENOUS | Status: DC | PRN
  Administered 2014-10-14: 10:00:00 via INTRAVENOUS

## 2014-10-14 MED ORDER — OXYCODONE-ACETAMINOPHEN 7.5-325 MG PO TABS
1.0000 | ORAL_TABLET | ORAL | Status: DC | PRN
  Administered 2014-10-14: 1 via ORAL

## 2014-10-14 MED ORDER — LACTATED RINGERS IV SOLN
Freq: Once | INTRAVENOUS | Status: AC
  Administered 2014-10-14: 10:00:00 via INTRAVENOUS

## 2014-10-14 MED ORDER — FENTANYL CITRATE (PF) 100 MCG/2ML IJ SOLN
INTRAMUSCULAR | Status: DC | PRN
  Administered 2014-10-14: 20 ug via INTRAVENOUS
  Administered 2014-10-14: 100 ug via INTRAVENOUS
  Administered 2014-10-14: 50 ug via INTRAVENOUS

## 2014-10-14 MED ORDER — FAMOTIDINE 20 MG PO TABS
20.0000 mg | ORAL_TABLET | Freq: Once | ORAL | Status: AC
  Administered 2014-10-14: 20 mg via ORAL

## 2014-10-14 MED ORDER — MIDAZOLAM HCL 2 MG/2ML IJ SOLN
INTRAMUSCULAR | Status: DC | PRN
  Administered 2014-10-14 (×2): 2 mg via INTRAVENOUS

## 2014-10-14 SURGICAL SUPPLY — 28 items
BANDAGE ELASTIC 3 CLIP NS LF (GAUZE/BANDAGES/DRESSINGS) ×4 IMPLANT
BANDAGE ELASTIC 3 CLIP ST LF (GAUZE/BANDAGES/DRESSINGS) ×4 IMPLANT
BLADE SURG MINI STRL (BLADE) IMPLANT
BNDG ESMARK 4X12 TAN STRL LF (GAUZE/BANDAGES/DRESSINGS) ×4 IMPLANT
CANISTER SUCT 1200ML W/VALVE (MISCELLANEOUS) ×2 IMPLANT
CHLORAPREP W/TINT 26ML (MISCELLANEOUS) ×4 IMPLANT
CUFF TOURN 18 STER (MISCELLANEOUS) ×2 IMPLANT
DRAPE EXTREMITY 106X87X128.5 (DRAPES) ×4 IMPLANT
DRAPE IMP U-DRAPE 54X76 (DRAPES) ×4 IMPLANT
DRAPE SHEET LG 3/4 BI-LAMINATE (DRAPES) ×2 IMPLANT
DRSG TEGADERM 2-3/8X2-3/4 SM (GAUZE/BANDAGES/DRESSINGS) ×2 IMPLANT
GAUZE PETRO XEROFOAM 1X8 (MISCELLANEOUS) ×2 IMPLANT
GAUZE SPONGE 4X4 12PLY STRL (GAUZE/BANDAGES/DRESSINGS) ×4 IMPLANT
GLOVE BIO SURGEON STRL SZ7.5 (GLOVE) ×12 IMPLANT
GOWN STRL REUS W/ TWL LRG LVL3 (GOWN DISPOSABLE) ×3 IMPLANT
GOWN STRL REUS W/TWL LRG LVL3 (GOWN DISPOSABLE) ×3
KIT RM TURNOVER STRD PROC AR (KITS) ×2 IMPLANT
NS IRRIG 500ML POUR BTL (IV SOLUTION) ×2 IMPLANT
PACK EXTREMITY ARMC (MISCELLANEOUS) ×2 IMPLANT
PAD GROUND ADULT SPLIT (MISCELLANEOUS) ×2 IMPLANT
PAD PREP 24X41 OB/GYN DISP (PERSONAL CARE ITEMS) ×4 IMPLANT
PADDING CAST 3IN STRL (MISCELLANEOUS) ×2
PADDING CAST BLEND 3X4 STRL (MISCELLANEOUS) ×2 IMPLANT
STOCKINETTE STRL 4IN 9604848 (GAUZE/BANDAGES/DRESSINGS) ×4 IMPLANT
SUT ETHILON 4-0 (SUTURE) ×2
SUT ETHILON 4-0 FS2 18XMFL BLK (SUTURE) ×2
SUTURE ETHLN 4-0 FS2 18XMF BLK (SUTURE) ×2 IMPLANT
SWABSTK COMLB BENZOIN TINCTURE (MISCELLANEOUS) ×2 IMPLANT

## 2014-10-14 NOTE — Anesthesia Postprocedure Evaluation (Signed)
  Anesthesia Post-op Note  Patient: Kathleen Rush  Procedure(s) Performed: Procedure(s): BILATERAL CARPAL TUNNEL RELEASE (Bilateral)  Anesthesia type:General  Patient location: PACU  Post pain: Pain level controlled  Post assessment: Post-op Vital signs reviewed, Patient's Cardiovascular Status Stable, Respiratory Function Stable, Patent Airway and No signs of Nausea or vomiting  Post vital signs: Reviewed and stable  Last Vitals:  Filed Vitals:   10/14/14 1215  BP: 180/90  Pulse: 54  Temp:   Resp: 14    Level of consciousness: awake, alert  and patient cooperative  Complications: No apparent anesthesia complications

## 2014-10-14 NOTE — H&P (Signed)
  39 year old female with bilateral carpal tunnel syndrome.  History and Physical has been placed on the chart as a paper document from my office.  Heart and lungs clear.  ENT normal.  Plan: bilateral carpal tunnel release

## 2014-10-14 NOTE — Transfer of Care (Signed)
Immediate Anesthesia Transfer of Care Note  Patient: Kathleen Rush  Procedure(s) Performed: Procedure(s): BILATERAL CARPAL TUNNEL RELEASE (Bilateral)  Patient Location: PACU  Anesthesia Type:General  Level of Consciousness: sedated  Airway & Oxygen Therapy: Patient connected to face mask oxygen  Post-op Assessment: Report given to RN  Post vital signs: Reviewed and stable  Last Vitals:  Filed Vitals:   10/14/14 0906  BP: 198/100  Pulse: 73  Temp: 37.1 C  Resp: 16    Complications: No apparent anesthesia complications

## 2014-10-14 NOTE — Discharge Instructions (Addendum)
Elevate arms at all times May remove all bandages in 24 hours, bathe, get wet, cover wounds with Bandaids Wear braces as necessary for comfort.AMBULATORY SURGERY    DISCHARGE INSTRUCTIONS   1) The drugs that you were given will stay in your system until tomorrow so for the next 24 hours you should not:  A) Drive an automobile B) Make any legal decisions C) Drink any alcoholic beverage   2) You may resume regular meals tomorrow.  Today it is better to start with liquids and gradually work up to solid foods.  You may eat anything you prefer, but it is better to start with liquids, then soup and crackers, and gradually work up to solid foods.   3) Please notify your doctor immediately if you have any unusual bleeding, trouble breathing, redness and pain at the surgery site, drainage, fever, or pain not relieved by medication. 4)   5) Your post-operative visit with Dr.                                     is: Date:                        Time:    Please call to schedule your post-operative visit.  6) Additional Instructions: AMBULATORY SURGERY  DISCHARGE INSTRUCTIONS   7) The drugs that you were given will stay in your system until tomorrow so for the next 24 hours you should not:  D) Drive an automobile E) Make any legal decisions F) Drink any alcoholic beverage   8) You may resume regular meals tomorrow.  Today it is better to start with liquids and gradually work up to solid foods.  You may eat anything you prefer, but it is better to start with liquids, then soup and crackers, and gradually work up to solid foods.   9) Please notify your doctor immediately if you have any unusual bleeding, trouble breathing, redness and pain at the surgery site, drainage, fever, or pain not relieved by medication. 10)   11) Your post-operative visit with Dr.                                     is: Date:                        Time:    Please call to schedule your post-operative  visit.  12) Additional Instructions: 13)

## 2014-10-14 NOTE — Anesthesia Procedure Notes (Signed)
Procedure Name: LMA Insertion Date/Time: 10/14/2014 10:40 AM Performed by: Junious Silk Pre-anesthesia Checklist: Patient identified, Patient being monitored, Timeout performed, Emergency Drugs available and Suction available Patient Re-evaluated:Patient Re-evaluated prior to inductionOxygen Delivery Method: Circle system utilized Preoxygenation: Pre-oxygenation with 100% oxygen Intubation Type: IV induction Ventilation: Mask ventilation without difficulty LMA: LMA inserted LMA Size: 3.5 Tube type: Oral Number of attempts: 1 Placement Confirmation: positive ETCO2 and breath sounds checked- equal and bilateral Tube secured with: Tape Dental Injury: Teeth and Oropharynx as per pre-operative assessment

## 2014-10-14 NOTE — Anesthesia Preprocedure Evaluation (Signed)
Anesthesia Evaluation  Patient identified by MRN, date of birth, ID band Patient awake    Reviewed: Allergy & Precautions, NPO status , Patient's Chart, lab work & pertinent test results  History of Anesthesia Complications Negative for: history of anesthetic complications  Airway Mallampati: III  TM Distance: >3 FB Neck ROM: Full    Dental  (+) Teeth Intact   Pulmonary former smoker (quit x 15 yrs),          Cardiovascular hypertension, Pt. on medications     Neuro/Psych Seizures - (pt sttates that she passes out, she thinks it is seiizures), Poorly Controlled,  Anxiety    GI/Hepatic   Endo/Other    Renal/GU      Musculoskeletal   Abdominal   Peds  Hematology  (+) anemia ,   Anesthesia Other Findings   Reproductive/Obstetrics                             Anesthesia Physical Anesthesia Plan  ASA: II  Anesthesia Plan: General   Post-op Pain Management:    Induction: Intravenous  Airway Management Planned: LMA  Additional Equipment:   Intra-op Plan:   Post-operative Plan:   Informed Consent: I have reviewed the patients History and Physical, chart, labs and discussed the procedure including the risks, benefits and alternatives for the proposed anesthesia with the patient or authorized representative who has indicated his/her understanding and acceptance.     Plan Discussed with:   Anesthesia Plan Comments:         Anesthesia Quick Evaluation

## 2014-10-14 NOTE — Brief Op Note (Signed)
10/14/2014  11:35 AM  PATIENT:  Kathleen Rush  39 y.o. female  PRE-OPERATIVE DIAGNOSIS:  CARPAL TUNNEL SYNDROME BILATERAL  POST-OPERATIVE DIAGNOSIS:  CARPAL TUNNEL SYNDROME BILATERAL  PROCEDURE:  Procedure(s): BILATERAL CARPAL TUNNEL RELEASE (Bilateral)  SURGEON:  Surgeon(s) and Role:    * Myra Rude, MD - Primary  PHYSICIAN ASSISTANT:   ASSISTANTS: none   ANESTHESIA:   general  EBL:  Total I/O In: 700 [I.V.:700] Out: -   BLOOD ADMINISTERED:none  DRAINS: none   LOCAL MEDICATIONS USED:  BUPIVICAINE   SPECIMEN:  No Specimen  DISPOSITION OF SPECIMEN:  N/A  COUNTS:  YES  TOURNIQUET:   Total Tourniquet Time Documented: Upper Arm (Right) - 14 minutes Upper Arm (Right) - 10 minutes Total: Upper Arm (Right) - 24 minutes   DICTATION: .Other Dictation: Dictation Number [id number]  PLAN OF CARE: Discharge to home after PACU  PATIENT DISPOSITION:  PACU - hemodynamically stable.   Delay start of Pharmacological VTE agent (>24hrs) due to surgical blood loss or risk of bleeding: not applicable

## 2014-10-15 NOTE — Op Note (Signed)
NAMERIKIA, TRINKLE NO.:  [id number]  MEDICAL RECORD NO.:  [id number]  LOCATION:  [facility]                         FACILITY:  [facility]  PHYSICIAN:  Reita Chard, MD        DATE OF BIRTH:  [birth year]  DATE OF PROCEDURE:  10/14/2014 DATE OF DISCHARGE:  10/14/2014                              OPERATIVE REPORT   PREOPERATIVE DIAGNOSIS:  Bilateral carpal tunnel syndrome.  POSTOPERATIVE DIAGNOSIS:  Bilateral carpal tunnel syndrome.  PROCEDURE PERFORMED: 1. Right carpal tunnel release. 2. Left carpal tunnel release.  SURGEON:  Reita Chard, MD  SURGEON:  Reita Chard, MD  ANESTHESIA:  General.  COMPLICATIONS:  None.  TOURNIQUET TIME:  Approximately 15 minutes on each side.  DESCRIPTION OF PROCEDURE:  After adequate induction of general anesthesia, both upper extremities were thoroughly prepped with alcohol and ChloraPrep and draped in a standard sterile fashion.  Regular tourniquet was used on the right side and a sterile tourniquet was used on the left side because of an intravenous line in the antecubital fossa.  Identical procedures are performed.  The extremity was wrapped out with the Esmarch bandage.  Tourniquet was elevated to 250 mmHg on the right and 150 mmHg on the left.  Standard volar carpal tunnel incision was made under loupe magnification and then dissection carried down to the transverse retinacular ligament.  The ligament was incised longitudinally in the midportion with the knife.  The distal release was performed with the small scissors.  The proximal release was performed with the small scissors and the carpal tunnel scissors.  On each side, there seemed to be moderate-to-severe compression of the nerve directly beneath the ligament.  Careful check was made both proximally and distally to ensure the complete release had been obtained.  The wound was thoroughly irrigated multiple times.  Skin edges were infiltrated with 0.5% Marcaine with  epinephrine.  Skin was closed with 4-0 nylon.  Soft bulky dressing was applied.  The patient returned to the recovery room in satisfactory condition, having tolerated the procedure quite well.          ______________________________ Reita Chard, MD     CS/MEDQ  D:  10/15/2014  T:  10/15/2014  Job:  [id number]

## 2014-10-16 ENCOUNTER — Ambulatory Visit (INDEPENDENT_AMBULATORY_CARE_PROVIDER_SITE_OTHER): Payer: Medicaid Other | Admitting: Family Medicine

## 2014-10-16 ENCOUNTER — Encounter: Payer: Self-pay | Admitting: Family Medicine

## 2014-10-16 VITALS — BP 160/119 | HR 68 | Temp 99.8°F | Ht 64.0 in | Wt 237.0 lb

## 2014-10-16 DIAGNOSIS — L309 Dermatitis, unspecified: Secondary | ICD-10-CM

## 2014-10-16 DIAGNOSIS — I10 Essential (primary) hypertension: Secondary | ICD-10-CM | POA: Diagnosis not present

## 2014-10-16 NOTE — Progress Notes (Signed)
BP 160/119 mmHg  Pulse 68  Temp(Src) 99.8 F (37.7 C) (Oral)  Ht  (1.626 m)  Wt 237 lb (107.502 kg)  BMI 40.66 kg/m2  SpO2 100%   Subjective:    Patient ID: Kathleen Rush, female    DOB: 12-05-1975, 39 y.o.   MRN: [id number]  HPI: Kathleen Rush is a 39 y.o. female  Chief Complaint  Patient presents with  . Follow-up    Blood pressure   Here for f/u after CTS and going to see derm soon  Hypertension BP was 198 at the surgeon's office when they did the surgery She had it rechecked 145/98 Taking hydralazine and lisinopril; not taking for financial reasons  Checking blood pressure away from here?  no If taking medicines, are you taking them regularly?  NO Siblings / family history: Does high blood pressure run in your family?   YES Salt:  Trying to limit sodium / salt when buying foods at the grocery store?  NO   Do you try to limit added salt when cooking and at the table?  yes Sweets/licorice:  Do you eat a lot of sweets or eat black licorice?  no, not a lot of candy Saturated fats: Do you eat a lot of foods like bacon, sausage, pepperoni, cheeseburgers, hot dogs, bologna, and cheese?  YES Steroids/Non-steroidals:  Have you had a recent cortisone shot in the last few months?  no  Do you take prednisone or prescription NSAIDs, , or take OTCS NSAIDs such as ibuprofen, Motrin, Advil, Aleve, or naproxen? Smoking: Do you smoke?  no Snoring / sleep apnea: Do you snore or have sleep apnea?  YES just snoring, no apneaStroh's (alcohol): Do you drink alcohol  yes  --- if yes, do you drink more than 14 drinks/week if female or more than 7 drinks/week if female?  no Sudafed (decongestants): Do you use any OTC decongestant products like Allegra-D, Claritin-D, Zyrtec-D, Tylenol Cold and Sinus, etc.?  no  She saw gynecologist, went to [facility] hospital and saw on something that started with the letter M and it stopped her cramps; big while pill; last period was Feb; she'll call their    Relevant past medical, surgical, family and social history reviewed and updated as indicated. Interim medical history since our last visit reviewed. Allergies and medications reviewed and updated.  Review of Systems  Per HPI unless specifically indicated above     Objective:    BP 160/119 mmHg  Pulse 68  Temp(Src) 99.8 F (37.7 C) (Oral)  Ht  (1.626 m)  Wt 237 lb (107.502 kg)  BMI 40.66 kg/m2  SpO2 100%  Wt Readings from Last 3 Encounters:  10/16/14 237 lb (107.502 kg)  10/10/14 230 lb (104.327 kg)  09/18/14 237 lb (107.502 kg)    Physical Exam  Constitutional: She appears well-developed and well-nourished.  Eyes: No scleral icterus.  Cardiovascular: Normal rate and regular rhythm.   Pulmonary/Chest: Effort normal and breath sounds normal.  Musculoskeletal: She exhibits no edema.    Results for orders placed or performed during the hospital encounter of 10/14/14  Potassium  Result Value Ref Range   Potassium 3.2 (L) 3.5 - 5.1 mmol/L  I-Stat 4 (Na,K,GLUC,HGB,HCT) ([facility] man. entry)  Result Value Ref Range   Sodium  135 - 145 mmol/L   Potassium 3.6 3.4 - 5.3 mmol/L   Glucose, Bld  70 - 99 mg/dL   HCT  36 - 46 %   Hemoglobin  12.0 - 16.0 g/dL  Pregnancy, urine POC  Result Value Ref Range   Preg Test, Ur NEGATIVE NEGATIVE      Assessment & Plan:   Problem List Items Addressed This Visit      Cardiovascular and Mediastinum   Hypertension    Not controlled today off of two medicines Check at pharmacy or come back by here just for CMA visit in 1 week       Other Visit Diagnoses    Eczema    -  Primary    Relevant Orders    Ambulatory referral to Dermatology        Follow up plan: Return in about 1 week (around 10/23/2014) for with CMA for BP check, 1 month with Dr. Sherie Don.

## 2014-10-16 NOTE — Patient Instructions (Addendum)
STOP ibuprofen and naproxen Your goal blood pressure is less than 140 mmHg on top and less than 90 on the bottom Try to follow the DASH guidelines (DASH stands for Dietary Approaches to Stop Hypertension) Try to limit the sodium in your diet.  Ideally, consume less than 1.5 grams (less than 1,500mg ) per day. Do not add salt when cooking or at the table.  Check the sodium amount on labels when shopping, and choose items lower in sodium when given a choice. Avoid or limit foods that already contain a lot of sodium. Eat a diet rich in fruits and vegetables and whole grains. Choose lower salt foods when possible Do please start your other blood pressure medicines

## 2014-10-16 NOTE — Assessment & Plan Note (Signed)
Not controlled today off of two medicines Check at pharmacy or come back by here just for CMA visit in 1 week

## 2014-10-23 ENCOUNTER — Other Ambulatory Visit: Payer: Self-pay

## 2014-10-23 ENCOUNTER — Other Ambulatory Visit: Payer: Self-pay | Admitting: Family Medicine

## 2014-10-23 ENCOUNTER — Ambulatory Visit (INDEPENDENT_AMBULATORY_CARE_PROVIDER_SITE_OTHER): Payer: Medicaid Other | Admitting: Family Medicine

## 2014-10-23 ENCOUNTER — Telehealth: Payer: Self-pay

## 2014-10-23 VITALS — BP 171/87 | HR 60

## 2014-10-23 DIAGNOSIS — Z136 Encounter for screening for cardiovascular disorders: Secondary | ICD-10-CM

## 2014-10-23 DIAGNOSIS — Z0130 Encounter for examination of blood pressure without abnormal findings: Secondary | ICD-10-CM

## 2014-10-23 DIAGNOSIS — Z0120 Encounter for dental examination and cleaning without abnormal findings: Secondary | ICD-10-CM

## 2014-10-23 DIAGNOSIS — I10 Essential (primary) hypertension: Secondary | ICD-10-CM

## 2014-10-23 MED ORDER — HYDRALAZINE HCL 25 MG PO TABS: 25.0000 mg | ORAL_TABLET | Freq: Three times a day (TID) | ORAL | Status: DC

## 2014-10-23 MED ORDER — AMLODIPINE BESYLATE 10 MG PO TABS: 10.0000 mg | ORAL_TABLET | Freq: Every day | ORAL | Status: DC

## 2014-10-23 MED ORDER — FLUOXETINE HCL 40 MG PO CAPS: 40.0000 mg | ORAL_CAPSULE | Freq: Every day | ORAL | Status: DC

## 2014-10-23 MED ORDER — METOPROLOL SUCCINATE ER 25 MG PO TB24: 25.0000 mg | ORAL_TABLET | Freq: Every day | ORAL | Status: DC

## 2014-10-23 MED ORDER — LISINOPRIL-HYDROCHLOROTHIAZIDE 20-12.5 MG PO TABS: 2.0000 | ORAL_TABLET | Freq: Every morning | ORAL | Status: DC

## 2014-10-23 NOTE — Telephone Encounter (Signed)
Done

## 2014-10-23 NOTE — Telephone Encounter (Signed)
She needs a referral to go to [facility] in [place] for [place] dental exam. [phone]

## 2014-10-23 NOTE — Progress Notes (Signed)
Patient came in for BP check, states she is doing fine but needs refills on all of her meds. BP is 171/87. Per Dr. Sherie DonLada, increase Amlodipine to 10mg  and recheck in 1 week.  ----------------------- This showed up in my box in March 2017; I am not sure what to do with this beyond what has already been documented by the CMA Will close visit ----------------------- I cannot close visit; red box states "The encounter provider is a generic resource. Please change the encounter provider to the actual provider. I did not see the patient that day. ------------------------ I tried entering update under A/P, and changing to "no charge"; it still will not let me close out this note Forwarding to office manager Dr. Sherie DonLada 07/01/15

## 2014-10-23 NOTE — Telephone Encounter (Signed)
She needs refills on meds and also a new rx for Amlodipine . Progress note in chart from BP check.

## 2014-10-30 ENCOUNTER — Ambulatory Visit (INDEPENDENT_AMBULATORY_CARE_PROVIDER_SITE_OTHER): Payer: Medicaid Other

## 2014-10-30 ENCOUNTER — Telehealth: Payer: Self-pay

## 2014-10-30 VITALS — BP 128/77 | HR 65

## 2014-10-30 DIAGNOSIS — I10 Essential (primary) hypertension: Secondary | ICD-10-CM

## 2014-10-30 NOTE — Telephone Encounter (Signed)
She wants to know if she can go back on her Ibuprofen  for her migraines since her BP is doing better.

## 2014-10-30 NOTE — Patient Instructions (Signed)
Increase Amlodipine to  and recheck in 1 week.

## 2014-10-30 NOTE — Progress Notes (Signed)
Patient came in for BP check. It was 128/77 today. Patient reports no complaints, issues, or side effects. Is doing well since increasing Amlodipine to 10 mg.

## 2014-10-31 NOTE — Telephone Encounter (Signed)
Left message to call.

## 2014-10-31 NOTE — Telephone Encounter (Signed)
I actually don't recommend it Long-term use of ibuprofen can cause kidney damage I would actually suggest excedrin migraine which is the most effective OTC product patient's have shared with me for migraines Hydration is important too to limit headaches; try to drink 64 ounces of water or caffeine-free beverages per day

## 2014-11-03 NOTE — Addendum Note (Signed)
Addendum  created 11/03/14 1333 by Naomie DeanWilliam K Shawnae Leiva, MD   Modules edited: Anesthesia Responsible Staff

## 2014-11-04 DIAGNOSIS — Z0130 Encounter for examination of blood pressure without abnormal findings: Secondary | ICD-10-CM | POA: Insufficient documentation

## 2014-11-04 NOTE — Telephone Encounter (Signed)
Left detailed message for patient.

## 2014-11-05 ENCOUNTER — Telehealth: Payer: Self-pay | Admitting: *Deleted

## 2014-11-05 NOTE — Telephone Encounter (Addendum)
Pt left message that she is calling about her cramping and bleeding.  She states that she got a Mirena put in which was supposed to stop her bleeding. Her period has started and she is bleeding heavily. She wants a prescription sent to her pharmacy for the medication that she previously had to stop her bleeding. She uses [facility] in [place], [place].  After consulting with Dr. Erin FullingHarraway-Smith, I called pt and discussed her concern. She stated that she has had several episodes of spotting since the Mirena was inserted in March but this is the first real period. She is changing a tampon every 2-3 hrs and is also having clotting. I advised pt that it is normal to have irregular periods within the first 6 months after IUD insertion. This includes heavy bleeding as well. The amount of bleeding she reports is within normal limits. I advised that she take ibuprofen even if she is not having cramping as it may help decrease the amount of bleeding. Generally we do not prescribe Megace when a patient has an IUD. I also advised that it may be better to use pads so that she is better aware of the amount of bleeding and decrease the chance of accidental IUD removal. Pt voiced understanding and had no additional questions.

## 2014-11-14 ENCOUNTER — Encounter: Payer: Self-pay | Admitting: Family Medicine

## 2014-11-14 ENCOUNTER — Ambulatory Visit (INDEPENDENT_AMBULATORY_CARE_PROVIDER_SITE_OTHER): Payer: Medicaid Other | Admitting: Family Medicine

## 2014-11-14 VITALS — BP 132/80 | HR 59 | Temp 97.9°F | Ht 63.5 in | Wt 238.0 lb

## 2014-11-14 DIAGNOSIS — Z5181 Encounter for therapeutic drug level monitoring: Secondary | ICD-10-CM

## 2014-11-14 DIAGNOSIS — G8918 Other acute postprocedural pain: Secondary | ICD-10-CM

## 2014-11-14 DIAGNOSIS — I10 Essential (primary) hypertension: Secondary | ICD-10-CM

## 2014-11-14 MED ORDER — OXYCODONE-ACETAMINOPHEN 5-325 MG PO TABS
1.0000 | ORAL_TABLET | Freq: Four times a day (QID) | ORAL | Status: DC | PRN
Start: 2014-11-14 — End: 2015-02-18

## 2014-11-14 NOTE — Patient Instructions (Signed)
Continue the great job with your medicine compliance Do call Dr. Katrinka Blazing for refills for your hand pain, and I have only refilled today since we were unable to reach him If he wants to address pain chronically, have him please call me but we'd like to manage post-operative pain Return to see me in 3 months

## 2014-11-14 NOTE — Progress Notes (Signed)
BP 132/80 mmHg  Pulse 59  Temp(Src) 97.9 F (36.6 C)  Ht 5' 3.5" (1.613 m)  Wt 238 lb (107.956 kg)  BMI 41.49 kg/m2  SpO2 98%  LMP 10/12/2014 (Approximate)   Subjective:    Patient ID: Kathleen Rush, female    DOB: 03-Jun-1975, 39 y.o.   MRN: [id number]  HPI: Kathleen Rush is a 39 y.o. female  Chief Complaint  Patient presents with  . Hypertension    med refill  . Hand Pain    med refill   She is here for blood pressure recheck; she has had out of control hypertension and has been seen for medication management as we try to get her pressures under control; she is pleased with today's numbers; no side effects from the medicine  She is still having pain in her hands; they will see her back in one month, but they suggested that she talk to her primary about pain medicine from here on out Moved into her new apartment, unpacking The orthopaedist had her on oxycodone/apap 7.5/325 post-operatively; no problems with constipation; on stool softeners; medicine does not make her feel loop or goofy or overmedicated; out of medicine; she tells me that the orthopaedist said that she should get the rest of her pain medicine from her primary (me); I do not have any documentation to that effect and it is Friday afternoon; my staff tried to contact the orthopaedist's office and we were unable to get anyone to answer  Relevant past medical, surgical, family and social history reviewed and updated as indicated. Interim medical history since our last visit reviewed. Allergies and medications reviewed and updated.   Current outpatient prescriptions:  .  amLODipine (NORVASC) 10 MG tablet, Take 1 tablet (10 mg total) by mouth daily., Disp: 30 tablet, Rfl: 3 .  FLUoxetine (PROZAC) 40 MG capsule, Take 1 capsule (40 mg total) by mouth daily., Disp: 30 capsule, Rfl: 3 .  hydrALAZINE (APRESOLINE) 25 MG tablet, Take 1 tablet (25 mg total) by mouth 3 (three) times daily., Disp: 90 tablet, Rfl: 2 .   levonorgestrel (MIRENA) 20 MCG/24HR IUD, 1 each by Intrauterine route once., Disp: , Rfl:  .  lisinopril-hydrochlorothiazide (PRINZIDE,ZESTORETIC) 20-12.5 MG per tablet, Take 2 tablets by mouth every morning., Disp: 60 tablet, Rfl: 0 .  metoprolol succinate (TOPROL-XL) 25 MG 24 hr tablet, Take 1 tablet (25 mg total) by mouth daily., Disp: 30 tablet, Rfl: 3 .  Multiple Vitamin (MULTIVITAMIN WITH MINERALS) TABS tablet, Take 1 tablet by mouth daily., Disp: , Rfl:  .  oxyCODONE-acetaminophen (PERCOCET) 7.5-325 MG per tablet, Take 1 tablet by mouth every 4 (four) hours as needed for severe pain., Disp: 30 tablet, Rfl: 0 .  pregabalin (LYRICA) 50 MG capsule, Take 1 capsule (50 mg total) by mouth 3 (three) times daily., Disp: 21 capsule, Rfl: 0 .  triamcinolone ointment (KENALOG) 0.1 %, Apply 1 application topically every morning. Apply for eczema, Disp: , Rfl: 0 .  vitamin C (ASCORBIC ACID) 500 MG tablet, Take 1 tablet (500 mg total) by mouth daily., Disp: 30 tablet, Rfl: 3 .  ferrous sulfate 325 (65 FE) MG tablet, Take 1 tablet (325 mg total) by mouth 2 (two) times daily with a meal. (Patient not taking: Reported on 11/14/2014), Disp: 60 tablet, Rfl: 3 .  oxyCODONE-acetaminophen (PERCOCET/ROXICET) 5-325 MG per tablet, Take 1 tablet by mouth every 6 (six) hours as needed for moderate pain or severe pain., Disp: 10 tablet, Rfl: 0  Review of Systems Per HPI  unless specifically indicated above     Objective:    BP 132/80 mmHg  Pulse 59  Temp(Src) 97.9 F (36.6 C)  Ht 5' 3.5" (1.613 m)  Wt 238 lb (107.956 kg)  BMI 41.49 kg/m2  SpO2 98%  LMP 10/12/2014 (Approximate)  Wt Readings from Last 3 Encounters:  11/14/14 238 lb (107.956 kg)  10/16/14 237 lb (107.502 kg)  10/10/14 230 lb (104.327 kg)    Physical Exam  Constitutional: She appears well-developed and well-nourished. No distress.  Eyes: EOM are normal. No scleral icterus.  Cardiovascular: Normal rate and regular rhythm.   Pulmonary/Chest:  Effort normal and breath sounds normal. She has no wheezes.  Abdominal: She exhibits no distension.  Musculoskeletal: She exhibits no edema.  Surgical scars over the flexor aspect of both hands/wrists consistent with recent bilateral carpal tunnel releases  Skin: No erythema. No pallor.  Psychiatric: She has a normal mood and affect. Her behavior is normal. Judgment and thought content normal.      Assessment & Plan:   Problem List Items Addressed This Visit      Cardiovascular and Mediastinum   Hypertension - Primary    Much improved; continue current regimen; check BMP to evaluate creatinine and potassium; DASH guidelines; since her BP has been so difficult to control, I do not want her using NSAIDs for pain; I agreed to give limited amount of oxycodone since I could not reach her orthopaedist, but will want him/her to manage post-operative pain unless we have a conversation about that; ten pills given only, again avoiding NSAIDs       Other Visit Diagnoses    Medication monitoring encounter        check BMP today; if normal, recheck in 6-12 months    Relevant Orders    Basic metabolic panel (Completed)    Post-operative pain        we tried to reach orthopaedist about my taking over post-operative pain mgmt; could not reach him/her (Fri 4-ish pm); ten pills given; rest to come from ortho       Follow up plan: Return in about 3 months (around 02/14/2015) for hypertension.

## 2014-11-15 ENCOUNTER — Encounter: Payer: Self-pay | Admitting: Family Medicine

## 2014-11-15 LAB — BASIC METABOLIC PANEL
BUN / CREAT RATIO: 16 (ref 8–20)
BUN: 13 mg/dL (ref 6–20)
CHLORIDE: 100 mmol/L (ref 97–108)
CO2: 28 mmol/L (ref 18–29)
Calcium: 9.3 mg/dL (ref 8.7–10.2)
Creatinine, Ser: 0.8 mg/dL (ref 0.57–1.00)
GFR calc non Af Amer: 93 mL/min/{1.73_m2} (ref >59)
GFR, EST AFRICAN AMERICAN: 107 mL/min/{1.73_m2} (ref >59)
GLUCOSE: 88 mg/dL (ref 65–99)
POTASSIUM: 4.1 mmol/L (ref 3.5–5.2)
Sodium: 141 mmol/L (ref 134–144)

## 2014-11-16 NOTE — Assessment & Plan Note (Signed)
Much improved; continue current regimen; check BMP to evaluate creatinine and potassium; DASH guidelines; since her BP has been so difficult to control, I do not want her using NSAIDs for pain; I agreed to give limited amount of oxycodone since I could not reach her orthopaedist, but will want him/her to manage post-operative pain unless we have a conversation about that; ten pills given only, again avoiding NSAIDs

## 2014-11-26 ENCOUNTER — Other Ambulatory Visit: Payer: Self-pay | Admitting: Family Medicine

## 2014-11-26 NOTE — Telephone Encounter (Signed)
Routing to provider  

## 2014-12-15 ENCOUNTER — Telehealth: Payer: Self-pay | Admitting: Family Medicine

## 2014-12-15 DIAGNOSIS — Z111 Encounter for screening for respiratory tuberculosis: Secondary | ICD-10-CM | POA: Insufficient documentation

## 2014-12-15 NOTE — Telephone Encounter (Signed)
Pt called needs order added for a TB blood test as a requirement for her employer. Can this be added to Epic? Pt needs to have this completed by 12/17/14, wants to come in tomorrow afternoon 12/16/14 for the blood test. Please advise or add lab.Thanks.

## 2014-12-15 NOTE — Telephone Encounter (Signed)
Yes, order entered; thank you

## 2014-12-15 NOTE — Telephone Encounter (Signed)
Routing to provider  

## 2014-12-16 ENCOUNTER — Other Ambulatory Visit: Payer: Medicaid Other

## 2014-12-16 DIAGNOSIS — Z111 Encounter for screening for respiratory tuberculosis: Secondary | ICD-10-CM

## 2014-12-19 LAB — QUANTIFERON IN TUBE
QFT TB AG MINUS NIL VALUE: 0.01 IU/mL
QUANTIFERON MITOGEN VALUE: >10 IU/mL
QUANTIFERON NIL VALUE: 0.06 [IU]/mL
QUANTIFERON TB AG VALUE: 0.07 [IU]/mL
QUANTIFERON TB GOLD: NEGATIVE

## 2014-12-19 LAB — QUANTIFERON TB GOLD ASSAY (BLOOD)

## 2014-12-24 ENCOUNTER — Other Ambulatory Visit: Payer: Self-pay | Admitting: Family Medicine

## 2014-12-24 NOTE — Telephone Encounter (Signed)
Routing to provider  

## 2015-02-11 ENCOUNTER — Ambulatory Visit (INDEPENDENT_AMBULATORY_CARE_PROVIDER_SITE_OTHER): Payer: Medicaid Other | Admitting: Obstetrics & Gynecology

## 2015-02-11 ENCOUNTER — Encounter: Payer: Self-pay | Admitting: Obstetrics & Gynecology

## 2015-02-11 ENCOUNTER — Other Ambulatory Visit (HOSPITAL_COMMUNITY)
Admission: RE | Admit: 2015-02-11 | Discharge: 2015-02-11 | Disposition: A | Discharge status: Home / Self Care | Payer: Medicaid Other | Source: Ambulatory Visit | Attending: Obstetrics & Gynecology | Admitting: Obstetrics & Gynecology

## 2015-02-11 VITALS — BP 153/83 | HR 63 | Temp 99.0°F | Wt 240.4 lb

## 2015-02-11 DIAGNOSIS — Z310 Encounter for reversal of previous sterilization: Secondary | ICD-10-CM

## 2015-02-11 DIAGNOSIS — Z30431 Encounter for routine checking of intrauterine contraceptive device: Secondary | ICD-10-CM

## 2015-02-11 DIAGNOSIS — Z113 Encounter for screening for infections with a predominantly sexual mode of transmission: Secondary | ICD-10-CM | POA: Diagnosis not present

## 2015-02-11 DIAGNOSIS — Z23 Encounter for immunization: Secondary | ICD-10-CM

## 2015-02-11 DIAGNOSIS — F5089 Other specified eating disorder: Secondary | ICD-10-CM

## 2015-02-11 DIAGNOSIS — D638 Anemia in other chronic diseases classified elsewhere: Secondary | ICD-10-CM

## 2015-02-11 DIAGNOSIS — N920 Excessive and frequent menstruation with regular cycle: Secondary | ICD-10-CM | POA: Diagnosis not present

## 2015-02-11 DIAGNOSIS — D500 Iron deficiency anemia secondary to blood loss (chronic): Secondary | ICD-10-CM

## 2015-02-11 LAB — CBC
HEMATOCRIT: 32.7 % — AB (ref 36.0–46.0)
Hemoglobin: 10.2 g/dL — ABNORMAL LOW (ref 12.0–15.0)
MCH: 24.8 pg — ABNORMAL LOW (ref 26.0–34.0)
MCHC: 31.2 g/dL (ref 30.0–36.0)
MCV: 79.6 fL (ref 78.0–100.0)
MPV: 9.8 fL (ref 8.6–12.4)
PLATELETS: 402 10*3/uL — AB (ref 150–400)
RBC: 4.11 MIL/uL (ref 3.87–5.11)
RDW: 14.3 % (ref 11.5–15.5)
WBC: 6.7 10*3/uL (ref 4.0–10.5)

## 2015-02-11 LAB — FERRITIN: Ferritin: 6 ng/mL — ABNORMAL LOW (ref 10–291)

## 2015-02-11 MED ORDER — IBUPROFEN 800 MG PO TABS: 800.0000 mg | ORAL_TABLET | Freq: Three times a day (TID) | ORAL | Status: DC | PRN

## 2015-02-11 MED ORDER — TRANEXAMIC ACID 650 MG PO TABS: 1300.0000 mg | ORAL_TABLET | Freq: Three times a day (TID) | ORAL | Status: DC

## 2015-02-11 NOTE — Progress Notes (Signed)
CLINIC ENCOUNTER NOTE  History:  39 y.o. [id number] here today for evaluation of Mirena IUD for management of menorrhagia.  The Mirena was placed on 06/23/14, patient reports bleeding the same amount every month as she did prior to insertion. Still has heavy bleeding and associated cramping; "the IUD did not work".  Also reports having pica around time of her periods; "I eat a lot of ice".  Desires STI screen and flu vaccine today, no symptoms.  She denies any abnormal vaginal discharge, other pelvic pain or other concerns.   Of note, patient says that she is interested in tubal sterilization referral and wants us to refer to appropriate provider.  Past Medical History  Diagnosis Date  . Anemia   . Syncopal episodes OCT 2015    PT STATES SHE IS TOLD SHE PASSES OUT BUT THINKS SHE HAS SEIZURES?  Marland Kitchen. Anxiety   . Hypertension   . Eczema     Past Surgical History  Procedure Laterality Date  . Cesarean section    . Tubal ligation    . Bilateral carpal tunnel release Bilateral 10/14/2014    Procedure: BILATERAL CARPAL TUNNEL RELEASE;  Surgeon: Myra Rudehristopher Smith, MD;  Location: [facility] ORS;  Service: Orthopedics;  Laterality: Bilateral;    The following portions of the patient's history were reviewed and updated as appropriate: allergies, current medications, past family history, past medical history, past social history, past surgical history and problem list.   Health Maintenance:  Normal pap in 2014.   Review of Systems:  Pertinent items noted in HPI and remainder of comprehensive ROS otherwise negative.  Objective:  Physical Exam BP 153/83 mmHg  Pulse 63  Temp(Src) 99 F (37.2 C)  Wt 240 lb 6.4 oz (109.045 kg)  LMP 02/01/2015 (Exact Date) CONSTITUTIONAL: Well-developed, well-nourished female in no acute distress.  HENT:  Normocephalic, atraumatic. External right and left ear normal. Oropharynx is clear and moist EYES: Conjunctivae and EOM are normal. Pupils are equal, round, and  reactive to light. No scleral icterus.  NECK: Normal range of motion, supple, no masses SKIN: Skin is warm and dry. No rash noted. Not diaphoretic. No erythema. No pallor. NEUROLGIC: Alert and oriented to person, place, and time. Normal reflexes, muscle tone coordination. No cranial nerve deficit noted. PSYCHIATRIC: Normal mood and affect. Normal behavior. Normal judgment and thought content. CARDIOVASCULAR: Normal heart rate noted RESPIRATORY: Effort and breath sounds normal, no problems with respiration noted ABDOMEN: Soft, no distention noted.   PELVIC: Normal appearing external genitalia; normal appearing vaginal mucosa and cervix.  IUD strings not visualized but able to be palpated in cervical canal using endocervical brush.  No abnormal discharge noted.  Normal uterine size, no other palpable masses, no uterine or adnexal tenderness. MUSCULOSKELETAL: Normal range of motion. No edema noted.   Assessment & Plan:  1. Menorrhagia with regular cycle with associated Pica Desires future pregnancy.  Mirena not helping, does not want other hormonal intervention. Does not want surgery for now.  Will do trial of Lysteda + high dose Ibuprofen during her menses to see if this helps.  Increased VTE risk with Lysteda discussed. CBC and Ferritin checked today due to report of pica.  Declines Mirena removal for now. - CBC - Ferritin - Tranexamic acid (LYSTEDA) 650 MG TABS tablet; Take 2 tablets (1,300 mg total) by mouth 3 (three) times daily. Take during menses for a maximum of five days  Dispense: 30 tablet; Refill: 2 - ibuprofen (ADVIL,MOTRIN) 800 MG tablet; Take 1 tablet (800  mg total) by mouth 3 (three) times daily with meals as needed for headache or moderate pain.  Dispense: 30 tablet; Refill: 3  2. Routine screening for STI (sexually transmitted infection) - Wet prep, genital - GC/Chlamydia probe amp ([facility])not at [facility] - Hepatitis B surface antigen - Hepatitis C antibody - HIV antibody -  RPR  3. Flu vaccine need - Flu Vaccine QUAD 36+ mos IM given today  4. Tubal reversal interest Referred to Dr. April Manson  Return in about 2 months (around 04/13/2015) for Annual exam, follow up .  Please refer to After Visit Summary for other counseling recommendations.    Total face-to-face time with patient: 25 minutes. Over 50% of encounter was spent on counseling and coordination of care.   Jaynie Collins, MD, FACOG Attending Obstetrician & Gynecologist, [facility] and [facility]

## 2015-02-11 NOTE — Patient Instructions (Signed)
Thank you for enrolling in MyChart. Please follow the instructions below to securely access your online medical record. MyChart allows you to send messages to your doctor, view your test results, manage appointments, and more.   How Do I Sign Up? 1. In your Internet browser, go to [employer] Address [employer] and enter [email or web address]. 2. Click on the Sign Up Now link in the Sign In box. You will see the New Member Sign Up page. 3. Enter your MyChart Access Code exactly as it appears below. You will not need to use this code after you've completed the sign-up process. If you do not sign up before the expiration date, you must request a new code.  MyChart Access Code: [email or web address] Expires: 04/03/2015  3:11 AM  4. Enter your Social Security Number ([id number]) and Date of Birth (mm/dd/yyyy) as indicated and click Submit. You will be taken to the next sign-up page. 5. Create a MyChart ID. This will be your MyChart login ID and cannot be changed, so think of one that is secure and easy to remember. 6. Create a MyChart password. You can change your password at any time. 7. Enter your Password Reset Question and Answer. This can be used at a later time if you forget your password.  8. Enter your e-mail address. You will receive e-mail notification when new information is available in MyChart. 9. Click Sign Up. You can now view your medical record.   Additional Information Remember, MyChart is NOT to be used for urgent needs. For medical emergencies, dial 911.

## 2015-02-12 LAB — WET PREP, GENITAL
TRICH WET PREP: NONE SEEN
Yeast Wet Prep HPF POC: NONE SEEN

## 2015-02-12 LAB — HEPATITIS B SURFACE ANTIGEN: Hepatitis B Surface Ag: NEGATIVE

## 2015-02-12 LAB — RPR

## 2015-02-12 LAB — HIV ANTIBODY (ROUTINE TESTING W REFLEX): HIV: NONREACTIVE

## 2015-02-12 LAB — HEPATITIS C ANTIBODY: HCV Ab: NEGATIVE

## 2015-02-12 MED ORDER — FERROUS SULFATE 325 (65 FE) MG PO TABS: 325.0000 mg | ORAL_TABLET | Freq: Two times a day (BID) | ORAL | Status: DC

## 2015-02-12 NOTE — Addendum Note (Signed)
Addended by: Jaynie CollinsANYANWU, UGONNA A on: 02/12/2015 01:26 PM   Modules accepted: Orders

## 2015-02-13 LAB — GC/CHLAMYDIA PROBE AMP (~~LOC~~) NOT AT ARMC
Chlamydia: NEGATIVE
Neisseria Gonorrhea: NEGATIVE

## 2015-02-18 ENCOUNTER — Telehealth: Payer: Self-pay

## 2015-02-18 ENCOUNTER — Encounter: Payer: Self-pay | Admitting: Family Medicine

## 2015-02-18 ENCOUNTER — Ambulatory Visit (INDEPENDENT_AMBULATORY_CARE_PROVIDER_SITE_OTHER): Payer: Medicaid Other | Admitting: Family Medicine

## 2015-02-18 DIAGNOSIS — I10 Essential (primary) hypertension: Secondary | ICD-10-CM | POA: Diagnosis not present

## 2015-02-18 DIAGNOSIS — D500 Iron deficiency anemia secondary to blood loss (chronic): Secondary | ICD-10-CM | POA: Diagnosis not present

## 2015-02-18 DIAGNOSIS — F419 Anxiety disorder, unspecified: Secondary | ICD-10-CM | POA: Diagnosis not present

## 2015-02-18 NOTE — Telephone Encounter (Signed)
Called pharmacy to discontinue medication because she hasn't taken medication in the last 30 days.   Pharmacist said that "she just came and picked it up" and that she had nothing to discontinue.

## 2015-02-18 NOTE — Patient Instructions (Addendum)
I'll refer you to see the bariatric surgery STOP the ibuprofen Continue the blood pressure pills Follow the DASH guidelines Check out the information at familydoctor.org entitled "What It Takes to Lose Weight" Try to lose between 1-2 pounds per week by taking in fewer calories and burning off more calories You can succeed by limiting portions, limiting foods dense in calories and fat, becoming more active, and drinking 8 glasses of water a day (64 ounces) Don't skip meals, especially breakfast, as skipping meals may alter your metabolism Do not use over-the-counter weight loss pills or gimmicks that claim rapid weight loss A healthy BMI (or body mass index) is between 18.5 and 24.9 You can calculate your ideal BMI at the NIH website JobEconomics.huhttp://www.nhlbi.nih.gov/health/educational/lose_wt/BMI/bmicalc.htm  Try to use PLAIN allergy medicine without the decongestant Avoid: phenylephrine, phenylpropanolamine, and pseudoephredine  If you need something for aches or pains, try to use Tylenol (acetaminophen) instead of non-steroidals (which include Aleve, ibuprofen, Advil, Motrin, and naproxen); non-steroidals can cause long-term kidney damage and raise your blood pressure   You can contact [employer] [facility] Address [address]  [place], [place] [address] (Entrance at rear of building)  [phone] (Fax)  Hours M-TH: 9:00am - 5:00pm By appointment only.  DASH Eating Plan DASH stands for "Dietary Approaches to Stop Hypertension." The DASH eating plan is a healthy eating plan that has been shown to reduce high blood pressure (hypertension). Additional health benefits may include reducing the risk of type 2 diabetes mellitus, heart disease, and stroke. The DASH eating plan may also help with weight loss. WHAT DO I NEED TO KNOW ABOUT THE DASH EATING PLAN? For the DASH eating plan, you will follow these general guidelines:  Choose foods with a percent daily value for  sodium of less than 5% (as listed on the food label).  Use salt-free seasonings or herbs instead of table salt or sea salt.  Check with your health care provider or pharmacist before using salt substitutes.  Eat lower-sodium products, often labeled as "lower sodium" or "no salt added."  Eat fresh foods.  Eat more vegetables, fruits, and low-fat dairy products.  Choose whole grains. Look for the word "whole" as the first word in the ingredient list.  Choose fish and skinless chicken or [place] more often than red meat. Limit fish, poultry, and meat to 6 oz (170 g) each day.  Limit sweets, desserts, sugars, and sugary drinks.  Choose heart-healthy fats.  Limit cheese to 1 oz (28 g) per day.  Eat more home-cooked food and less restaurant, buffet, and fast food.  Limit fried foods.  Cook foods using methods other than frying.  Limit canned vegetables. If you do use them, rinse them well to decrease the sodium.  When eating at a restaurant, ask that your food be prepared with less salt, or no salt if possible. WHAT FOODS CAN I EAT? Seek help from a dietitian for individual calorie needs. Grains Whole grain or whole wheat bread. Brown rice. Whole grain or whole wheat pasta. Quinoa, bulgur, and whole grain cereals. Low-sodium cereals. Corn or whole wheat flour tortillas. Whole grain cornbread. Whole grain crackers. Low-sodium crackers. Vegetables Fresh or frozen vegetables (raw, steamed, roasted, or grilled). Low-sodium or reduced-sodium tomato and vegetable juices. Low-sodium or reduced-sodium tomato sauce and paste. Low-sodium or reduced-sodium canned vegetables.  Fruits All fresh, canned (in natural juice), or frozen fruits. Meat and Other Protein Products Ground beef (85% or leaner), grass-fed beef, or beef trimmed of fat. Skinless chicken  or [place]. Ground chicken or [place]. Pork trimmed of fat. All fish and seafood. Eggs. Dried beans, peas, or lentils. Unsalted nuts and seeds.  Unsalted canned beans. Dairy Low-fat dairy products, such as skim or 1% milk, 2% or reduced-fat cheeses, low-fat ricotta or cottage cheese, or plain low-fat yogurt. Low-sodium or reduced-sodium cheeses. Fats and Oils Tub margarines without trans fats. Light or reduced-fat mayonnaise and salad dressings (reduced sodium). Avocado. Safflower, olive, or canola oils. Natural peanut or almond butter. Other Unsalted popcorn and pretzels. The items listed above may not be a complete list of recommended foods or beverages. Contact your dietitian for more options. WHAT FOODS ARE NOT RECOMMENDED? Grains White bread. White pasta. White rice. Refined cornbread. Bagels and croissants. Crackers that contain trans fat. Vegetables Creamed or fried vegetables. Vegetables in a cheese sauce. Regular canned vegetables. Regular canned tomato sauce and paste. Regular tomato and vegetable juices. Fruits Dried fruits. Canned fruit in light or heavy syrup. Fruit juice. Meat and Other Protein Products Fatty cuts of meat. Ribs, chicken wings, bacon, sausage, bologna, salami, chitterlings, fatback, hot dogs, bratwurst, and packaged luncheon meats. Salted nuts and seeds. Canned beans with salt. Dairy Whole or 2% milk, cream, half-and-half, and cream cheese. Whole-fat or sweetened yogurt. Full-fat cheeses or blue cheese. Nondairy creamers and whipped toppings. Processed cheese, cheese spreads, or cheese curds. Condiments Onion and garlic salt, seasoned salt, table salt, and sea salt. Canned and packaged gravies. Worcestershire sauce. Tartar sauce. Barbecue sauce. Teriyaki sauce. Soy sauce, including reduced sodium. Steak sauce. Fish sauce. Oyster sauce. Cocktail sauce. Horseradish. Ketchup and mustard. Meat flavorings and tenderizers. Bouillon cubes. Hot sauce. Tabasco sauce. Marinades. Taco seasonings. Relishes. Fats and Oils Butter, stick margarine, lard, shortening, ghee, and bacon fat. Coconut, palm kernel, or palm oils.  Regular salad dressings. Other Pickles and olives. Salted popcorn and pretzels. The items listed above may not be a complete list of foods and beverages to avoid. Contact your dietitian for more information. WHERE CAN I FIND MORE INFORMATION? National Heart, Lung, and Blood Institute: CablePromo.it   This information is not intended to replace advice given to you by your health care provider. Make sure you discuss any questions you have with your health care provider.   Document Released: 03/31/2011 Document Revised: 05/02/2014 Document Reviewed: 02/13/2013 Elsevier Interactive Patient Education [employer]
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
## 2015-02-23 DIAGNOSIS — D500 Iron deficiency anemia secondary to blood loss (chronic): Secondary | ICD-10-CM | POA: Insufficient documentation

## 2015-02-23 NOTE — Assessment & Plan Note (Signed)
Managed by GYN; on iron supplementation; has Mirena now to hopefully decrease heavy menses

## 2015-02-23 NOTE — Assessment & Plan Note (Signed)
Back on SSRI; continue; supportive listening provided

## 2015-02-23 NOTE — Assessment & Plan Note (Signed)
Patient's blood pressure likely affected by her morbid obesity; discussed phentermine, which I do not prescribe; she is interested in referral to bariatric surgery; I agree with this approach for her and entered referral; see AVS for recommendations regarding reduced caloric intake, adequate hydration

## 2015-02-23 NOTE — Assessment & Plan Note (Signed)
Patient had been off of her medicines, now back on for about a week; may take ACE-I a little longer to fully take effect; DASH guidelines encouraged; stress may have effect; morbid obesity certainly likely to have effect, and we discussed bariatric surgery as an option

## 2015-03-11 ENCOUNTER — Ambulatory Visit: Payer: Medicaid Other | Admitting: Family Medicine

## 2015-04-01 ENCOUNTER — Ambulatory Visit: Payer: Medicaid Other | Admitting: Obstetrics & Gynecology

## 2015-04-13 ENCOUNTER — Ambulatory Visit: Payer: Medicaid Other | Admitting: Obstetrics & Gynecology

## 2015-05-09 ENCOUNTER — Other Ambulatory Visit: Payer: Self-pay | Admitting: Family Medicine

## 2015-05-09 NOTE — Telephone Encounter (Signed)
Rx approved

## 2015-06-05 ENCOUNTER — Emergency Department
Admission: EM | Admit: 2015-06-05 | Discharge: 2015-06-06 | Disposition: A | Discharge status: Home / Self Care | Payer: Medicaid Other | Attending: Emergency Medicine | Admitting: Emergency Medicine

## 2015-06-05 ENCOUNTER — Encounter: Payer: Self-pay | Admitting: *Deleted

## 2015-06-05 ENCOUNTER — Emergency Department: Payer: Medicaid Other

## 2015-06-05 DIAGNOSIS — I10 Essential (primary) hypertension: Secondary | ICD-10-CM | POA: Diagnosis not present

## 2015-06-05 DIAGNOSIS — Y998 Other external cause status: Secondary | ICD-10-CM | POA: Diagnosis not present

## 2015-06-05 DIAGNOSIS — S199XXA Unspecified injury of neck, initial encounter: Secondary | ICD-10-CM | POA: Diagnosis present

## 2015-06-05 DIAGNOSIS — S3991XA Unspecified injury of abdomen, initial encounter: Secondary | ICD-10-CM | POA: Diagnosis not present

## 2015-06-05 DIAGNOSIS — Z3202 Encounter for pregnancy test, result negative: Secondary | ICD-10-CM | POA: Diagnosis not present

## 2015-06-05 DIAGNOSIS — S3992XA Unspecified injury of lower back, initial encounter: Secondary | ICD-10-CM | POA: Diagnosis not present

## 2015-06-05 DIAGNOSIS — Z79899 Other long term (current) drug therapy: Secondary | ICD-10-CM | POA: Diagnosis not present

## 2015-06-05 DIAGNOSIS — Y92410 Unspecified street and highway as the place of occurrence of the external cause: Secondary | ICD-10-CM | POA: Diagnosis not present

## 2015-06-05 DIAGNOSIS — S161XXA Strain of muscle, fascia and tendon at neck level, initial encounter: Secondary | ICD-10-CM | POA: Diagnosis not present

## 2015-06-05 DIAGNOSIS — Z7952 Long term (current) use of systemic steroids: Secondary | ICD-10-CM | POA: Diagnosis not present

## 2015-06-05 DIAGNOSIS — Y9389 Activity, other specified: Secondary | ICD-10-CM | POA: Diagnosis not present

## 2015-06-05 DIAGNOSIS — F17210 Nicotine dependence, cigarettes, uncomplicated: Secondary | ICD-10-CM | POA: Diagnosis not present

## 2015-06-05 DIAGNOSIS — E041 Nontoxic single thyroid nodule: Secondary | ICD-10-CM | POA: Diagnosis not present

## 2015-06-05 DIAGNOSIS — S0990XA Unspecified injury of head, initial encounter: Secondary | ICD-10-CM | POA: Insufficient documentation

## 2015-06-05 LAB — CBC WITH DIFFERENTIAL/PLATELET
Basophils Absolute: 0.1 10*3/uL (ref 0–0.1)
Basophils Relative: 1 %
Eosinophils Absolute: 0.1 10*3/uL (ref 0–0.7)
Eosinophils Relative: 2 %
HEMATOCRIT: 34.6 % — AB (ref 35.0–47.0)
HEMOGLOBIN: 10.8 g/dL — AB (ref 12.0–16.0)
LYMPHS ABS: 1.5 10*3/uL (ref 1.0–3.6)
LYMPHS PCT: 24 %
MCH: 24 pg — ABNORMAL LOW (ref 26.0–34.0)
MCHC: 31.3 g/dL — AB (ref 32.0–36.0)
MCV: 76.8 fL — AB (ref 80.0–100.0)
MONO ABS: 0.6 10*3/uL (ref 0.2–0.9)
MONOS PCT: 9 %
NEUTROS ABS: 4.1 10*3/uL (ref 1.4–6.5)
NEUTROS PCT: 64 %
Platelets: 301 10*3/uL (ref 150–440)
RBC: 4.5 MIL/uL (ref 3.80–5.20)
RDW: 15.9 % — AB (ref 11.5–14.5)
WBC: 6.4 10*3/uL (ref 3.6–11.0)

## 2015-06-05 LAB — COMPREHENSIVE METABOLIC PANEL
ALBUMIN: 3.7 g/dL (ref 3.5–5.0)
ALK PHOS: 58 U/L (ref 38–126)
ALT: 14 U/L (ref 14–54)
ANION GAP: 5 (ref 5–15)
AST: 20 U/L (ref 15–41)
BUN: 10 mg/dL (ref 6–20)
CHLORIDE: 104 mmol/L (ref 101–111)
CO2: 30 mmol/L (ref 22–32)
Calcium: 8.8 mg/dL — ABNORMAL LOW (ref 8.9–10.3)
Creatinine, Ser: 0.86 mg/dL (ref 0.44–1.00)
GFR calc non Af Amer: >60 mL/min (ref >60)
GLUCOSE: 106 mg/dL — AB (ref 65–99)
POTASSIUM: 3.3 mmol/L — AB (ref 3.5–5.1)
SODIUM: 139 mmol/L (ref 135–145)
Total Bilirubin: 0.4 mg/dL (ref 0.3–1.2)
Total Protein: 7.5 g/dL (ref 6.5–8.1)

## 2015-06-05 LAB — LIPASE, BLOOD: Lipase: 17 U/L (ref 11–51)

## 2015-06-05 LAB — HCG, QUANTITATIVE, PREGNANCY

## 2015-06-05 MED ORDER — IOHEXOL 350 MG/ML SOLN
100.0000 mL | Freq: Once | INTRAVENOUS | Status: AC | PRN
  Administered 2015-06-05: 100 mL via INTRAVENOUS

## 2015-06-05 MED ORDER — TRAMADOL HCL 50 MG PO TABS: 50.0000 mg | ORAL_TABLET | Freq: Four times a day (QID) | ORAL | Status: DC | PRN

## 2015-06-05 MED ORDER — FENTANYL CITRATE (PF) 100 MCG/2ML IJ SOLN
50.0000 ug | Freq: Once | INTRAMUSCULAR | Status: AC
  Administered 2015-06-05: 50 ug via INTRAVENOUS
  Filled 2015-06-05: qty 2

## 2015-06-05 NOTE — ED Notes (Signed)
Pt CT  
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
## 2015-06-05 NOTE — Discharge Instructions (Signed)
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
## 2015-06-05 NOTE — ED Notes (Signed)
Patient returned from CT

## 2015-06-05 NOTE — ED Notes (Signed)
McShane MD removed C-collar

## 2015-06-11 ENCOUNTER — Telehealth: Payer: Self-pay | Admitting: Family Medicine

## 2015-06-11 ENCOUNTER — Ambulatory Visit: Payer: Medicaid Other | Admitting: Family Medicine

## 2015-06-11 NOTE — Telephone Encounter (Signed)
Done

## 2015-06-11 NOTE — Telephone Encounter (Signed)
Routing to provider  

## 2015-06-11 NOTE — Telephone Encounter (Signed)
Pt called stated she has 2 sick clients and can not leave work and cancelled appt for today. Pt stated she needs a refill on her Hydralazine. Pharm is [facility] in [place]. Thanks.

## 2015-06-11 NOTE — Telephone Encounter (Signed)
I just approved a 6 month supply in January; please check prescription history and resolve with pharmacy; thank you

## 2015-07-01 NOTE — Assessment & Plan Note (Signed)
CMA check; increased amlodipine to 10 mg per doctor's direction; recheck BP in 1 week

## 2015-07-06 ENCOUNTER — Ambulatory Visit (INDEPENDENT_AMBULATORY_CARE_PROVIDER_SITE_OTHER): Payer: Medicaid Other | Admitting: Family Medicine

## 2015-07-06 ENCOUNTER — Encounter: Payer: Self-pay | Admitting: Family Medicine

## 2015-07-06 VITALS — BP 145/89 | HR 69 | Temp 98.6°F | Ht 63.25 in | Wt 253.0 lb

## 2015-07-06 DIAGNOSIS — E876 Hypokalemia: Secondary | ICD-10-CM | POA: Diagnosis not present

## 2015-07-06 DIAGNOSIS — M5416 Radiculopathy, lumbar region: Secondary | ICD-10-CM | POA: Insufficient documentation

## 2015-07-06 DIAGNOSIS — J029 Acute pharyngitis, unspecified: Secondary | ICD-10-CM

## 2015-07-06 DIAGNOSIS — M5417 Radiculopathy, lumbosacral region: Secondary | ICD-10-CM | POA: Diagnosis not present

## 2015-07-06 DIAGNOSIS — D500 Iron deficiency anemia secondary to blood loss (chronic): Secondary | ICD-10-CM

## 2015-07-06 DIAGNOSIS — D509 Iron deficiency anemia, unspecified: Secondary | ICD-10-CM | POA: Insufficient documentation

## 2015-07-06 DIAGNOSIS — Z0000 Encounter for general adult medical examination without abnormal findings: Secondary | ICD-10-CM | POA: Diagnosis not present

## 2015-07-06 DIAGNOSIS — I10 Essential (primary) hypertension: Secondary | ICD-10-CM

## 2015-07-06 MED ORDER — TRAMADOL HCL 50 MG PO TABS: 50.0000 mg | ORAL_TABLET | Freq: Four times a day (QID) | ORAL | Status: DC | PRN

## 2015-07-06 NOTE — Assessment & Plan Note (Addendum)
Not controlled today, but it turns out patient has not been taking her medicines; encouraged her to get back on her medicines; try to follow DASH guidelines; I think significant weight loss will also help; avoid decongestants; STOP ibuprofen; patient may monitor BP and contact me if systolic >= 140
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
## 2015-07-06 NOTE — Assessment & Plan Note (Addendum)
Reviewed CBCs, on iron; check labs today; stool cards given to be returned at her earliest convenience to make sure there is not a GI reason for blood loss in addition to her heavy menstrual periods (managed by GYN)

## 2015-07-06 NOTE — Assessment & Plan Note (Signed)
Noted on ER labs last month; she has been drinking OJ, so I hope this has resolved itself; will check as part o fher labwork today

## 2015-07-06 NOTE — Patient Instructions (Addendum)
I've put in referrals for you to meet with a bariatric surgery and orthopaedist If you have not heard anything from my staff in a week about any orders/referrals/studies from today, please contact us here to follow-up [phone]  Your goal blood pressure is less than 140 mmHg on top. Try to follow the DASH guidelines (DASH stands for Dietary Approaches to Stop Hypertension) Try to limit the sodium in your diet.  Ideally, consume less than 1.5 grams (less than 1,500mg ) per day. Do not add salt when cooking or at the table.  Check the sodium amount on labels when shopping, and choose items lower in sodium when given a choice. Avoid or limit foods that already contain a lot of sodium. Eat a diet rich in fruits and vegetables and whole grains. Try to get back on your blood pressure pills and take them regularly STOP the ibuprofen for pain; this can raise blood pressure  Try vitamin C (orange juice if not diabetic or vitamin C tablets) and drink green tea to help your immune system during your illness Get plenty of rest and hydration  Return the stool cards at your earliest convenience  I'll see you at [facility] in 3 months  DASH Eating Plan DASH stands for "Dietary Approaches to Stop Hypertension." The DASH eating plan is a healthy eating plan that has been shown to reduce high blood pressure (hypertension). Additional health benefits may include reducing the risk of type 2 diabetes mellitus, heart disease, and stroke. The DASH eating plan may also help with weight loss. WHAT DO I NEED TO KNOW ABOUT THE DASH EATING PLAN? For the DASH eating plan, you will follow these general guidelines:  Choose foods with a percent daily value for sodium of less than 5% (as listed on the food label).  Use salt-free seasonings or herbs instead of table salt or sea salt.  Check with your health care provider or pharmacist before using salt substitutes.  Eat lower-sodium products, often labeled as "lower  sodium" or "no salt added."  Eat fresh foods.  Eat more vegetables, fruits, and low-fat dairy products.  Choose whole grains. Look for the word "whole" as the first word in the ingredient list.  Choose fish and skinless chicken or [place] more often than red meat. Limit fish, poultry, and meat to 6 oz (170 g) each day.  Limit sweets, desserts, sugars, and sugary drinks.  Choose heart-healthy fats.  Limit cheese to 1 oz (28 g) per day.  Eat more home-cooked food and less restaurant, buffet, and fast food.  Limit fried foods.  Cook foods using methods other than frying.  Limit canned vegetables. If you do use them, rinse them well to decrease the sodium.  When eating at a restaurant, ask that your food be prepared with less salt, or no salt if possible. WHAT FOODS CAN I EAT? Seek help from a dietitian for individual calorie needs. Grains Whole grain or whole wheat bread. Brown rice. Whole grain or whole wheat pasta. Quinoa, bulgur, and whole grain cereals. Low-sodium cereals. Corn or whole wheat flour tortillas. Whole grain cornbread. Whole grain crackers. Low-sodium crackers. Vegetables Fresh or frozen vegetables (raw, steamed, roasted, or grilled). Low-sodium or reduced-sodium tomato and vegetable juices. Low-sodium or reduced-sodium tomato sauce and paste. Low-sodium or reduced-sodium canned vegetables.  Fruits All fresh, canned (in natural juice), or frozen fruits. Meat and Other Protein Products Ground beef (85% or leaner), grass-fed beef, or beef trimmed of fat. Skinless chicken or [place]. Ground chicken or  [place]. Pork trimmed of fat. All fish and seafood. Eggs. Dried beans, peas, or lentils. Unsalted nuts and seeds. Unsalted canned beans. Dairy Low-fat dairy products, such as skim or 1% milk, 2% or reduced-fat cheeses, low-fat ricotta or cottage cheese, or plain low-fat yogurt. Low-sodium or reduced-sodium cheeses. Fats and Oils Tub margarines without trans fats. Light or  reduced-fat mayonnaise and salad dressings (reduced sodium). Avocado. Safflower, olive, or canola oils. Natural peanut or almond butter. Other Unsalted popcorn and pretzels. The items listed above may not be a complete list of recommended foods or beverages. Contact your dietitian for more options. WHAT FOODS ARE NOT RECOMMENDED? Grains White bread. White pasta. White rice. Refined cornbread. Bagels and croissants. Crackers that contain trans fat. Vegetables Creamed or fried vegetables. Vegetables in a cheese sauce. Regular canned vegetables. Regular canned tomato sauce and paste. Regular tomato and vegetable juices. Fruits Dried fruits. Canned fruit in light or heavy syrup. Fruit juice. Meat and Other Protein Products Fatty cuts of meat. Ribs, chicken wings, bacon, sausage, bologna, salami, chitterlings, fatback, hot dogs, bratwurst, and packaged luncheon meats. Salted nuts and seeds. Canned beans with salt. Dairy Whole or 2% milk, cream, half-and-half, and cream cheese. Whole-fat or sweetened yogurt. Full-fat cheeses or blue cheese. Nondairy creamers and whipped toppings. Processed cheese, cheese spreads, or cheese curds. Condiments Onion and garlic salt, seasoned salt, table salt, and sea salt. Canned and packaged gravies. Worcestershire sauce. Tartar sauce. Barbecue sauce. Teriyaki sauce. Soy sauce, including reduced sodium. Steak sauce. Fish sauce. Oyster sauce. Cocktail sauce. Horseradish. Ketchup and mustard. Meat flavorings and tenderizers. Bouillon cubes. Hot sauce. Tabasco sauce. Marinades. Taco seasonings. Relishes. Fats and Oils Butter, stick margarine, lard, shortening, ghee, and bacon fat. Coconut, palm kernel, or palm oils. Regular salad dressings. Other Pickles and olives. Salted popcorn and pretzels. The items listed above may not be a complete list of foods and beverages to avoid. Contact your dietitian for more information. WHERE CAN I FIND MORE INFORMATION? National Heart,  Lung, and Blood Institute: [employer]   This information is not intended to replace advice given to you by your health care provider. Make sure you discuss any questions you have with your health care provider.   Document Released: 03/31/2011 Document Revised: 05/02/2014 Document Reviewed: 02/13/2013 Elsevier Interactive Patient Education [employer]

## 2015-07-06 NOTE — Assessment & Plan Note (Addendum)
Will refer to orthopaedist, as she is already established with one; I doubt I would be able to get MRI approved; will refill her tramadol today; cautions given (do not mix with alcohol); will try to avoid NSAIDs given her hypertension

## 2015-07-06 NOTE — Assessment & Plan Note (Addendum)
Refer to bariatric surgery; I think significant weight loss may help her blood pressure

## 2015-07-07 ENCOUNTER — Encounter: Payer: Self-pay | Admitting: Family Medicine

## 2015-07-07 LAB — COMPREHENSIVE METABOLIC PANEL
ALBUMIN: 3.7 g/dL (ref 3.5–5.5)
ALK PHOS: 59 IU/L (ref 39–117)
ALT: 11 IU/L (ref 0–32)
AST: 14 IU/L (ref 0–40)
Albumin/Globulin Ratio: 1.3 (ref 1.2–2.2)
BUN/Creatinine Ratio: 12 (ref 9–23)
BUN: 9 mg/dL (ref 6–24)
Bilirubin Total: 0.3 mg/dL (ref 0.0–1.2)
CO2: 26 mmol/L (ref 18–29)
CREATININE: 0.76 mg/dL (ref 0.57–1.00)
Calcium: 8.6 mg/dL — ABNORMAL LOW (ref 8.7–10.2)
Chloride: 103 mmol/L (ref 96–106)
GFR calc Af Amer: 113 mL/min/{1.73_m2} (ref >59)
GFR calc non Af Amer: 98 mL/min/{1.73_m2} (ref >59)
GLUCOSE: 103 mg/dL — AB (ref 65–99)
Globulin, Total: 2.9 g/dL (ref 1.5–4.5)
Potassium: 4.3 mmol/L (ref 3.5–5.2)
Sodium: 143 mmol/L (ref 134–144)
Total Protein: 6.6 g/dL (ref 6.0–8.5)

## 2015-07-07 LAB — CBC WITH DIFFERENTIAL/PLATELET
BASOS: 0 %
Basophils Absolute: 0 10*3/uL (ref 0.0–0.2)
EOS (ABSOLUTE): 0.1 10*3/uL (ref 0.0–0.4)
Eos: 2 %
Hematocrit: 35.7 % (ref 34.0–46.6)
Hemoglobin: 11.2 g/dL (ref 11.1–15.9)
IMMATURE GRANS (ABS): 0 10*3/uL (ref 0.0–0.1)
Immature Granulocytes: 1 %
LYMPHS: 24 %
Lymphocytes Absolute: 1.4 10*3/uL (ref 0.7–3.1)
MCH: 24.3 pg — AB (ref 26.6–33.0)
MCHC: 31.4 g/dL — ABNORMAL LOW (ref 31.5–35.7)
MCV: 78 fL — AB (ref 79–97)
Monocytes Absolute: 0.6 10*3/uL (ref 0.1–0.9)
Monocytes: 9 %
NEUTROS ABS: 3.8 10*3/uL (ref 1.4–7.0)
NEUTROS PCT: 64 %
PLATELETS: 300 10*3/uL (ref 150–379)
RBC: 4.6 x10E6/uL (ref 3.77–5.28)
RDW: 17.6 % — ABNORMAL HIGH (ref 12.3–15.4)
WBC: 5.9 10*3/uL (ref 3.4–10.8)

## 2015-07-07 LAB — LIPID PANEL W/O CHOL/HDL RATIO
Cholesterol, Total: 127 mg/dL (ref 100–199)
HDL: 45 mg/dL (ref >39)
LDL CALC: 43 mg/dL (ref 0–99)
Triglycerides: 196 mg/dL — ABNORMAL HIGH (ref 0–149)
VLDL CHOLESTEROL CAL: 39 mg/dL (ref 5–40)

## 2015-07-07 LAB — TSH: TSH: 1.07 u[IU]/mL (ref 0.450–4.500)

## 2015-07-07 LAB — FERRITIN: FERRITIN: 33 ng/mL (ref 15–150)

## 2015-07-08 LAB — RAPID STREP SCREEN (MED CTR MEBANE ONLY): STREP GP A AG, IA W/REFLEX: NEGATIVE

## 2015-07-08 LAB — CULTURE, GROUP A STREP: STREP A CULTURE: NEGATIVE

## 2015-07-21 ENCOUNTER — Other Ambulatory Visit: Payer: Self-pay | Admitting: Family Medicine

## 2015-07-21 NOTE — Telephone Encounter (Signed)
Sent with note to pharmacist to warn pt about serotonin syndrome if she is still taking tramadol

## 2015-08-06 ENCOUNTER — Ambulatory Visit: Payer: Medicaid Other | Admitting: Family Medicine

## 2015-09-02 ENCOUNTER — Encounter: Payer: Self-pay | Admitting: Obstetrics & Gynecology

## 2015-09-02 ENCOUNTER — Ambulatory Visit (INDEPENDENT_AMBULATORY_CARE_PROVIDER_SITE_OTHER): Payer: Medicaid Other | Admitting: Obstetrics & Gynecology

## 2015-09-02 VITALS — BP 160/97 | HR 64 | Resp 20 | Ht 60.0 in | Wt 259.0 lb

## 2015-09-02 DIAGNOSIS — N938 Other specified abnormal uterine and vaginal bleeding: Secondary | ICD-10-CM | POA: Diagnosis not present

## 2015-09-02 DIAGNOSIS — Z975 Presence of (intrauterine) contraceptive device: Secondary | ICD-10-CM | POA: Diagnosis not present

## 2015-09-02 DIAGNOSIS — Z3042 Encounter for surveillance of injectable contraceptive: Secondary | ICD-10-CM | POA: Diagnosis not present

## 2015-09-02 DIAGNOSIS — Z538 Procedure and treatment not carried out for other reasons: Secondary | ICD-10-CM

## 2015-09-02 MED ORDER — MEDROXYPROGESTERONE ACETATE 150 MG/ML IM SUSP
150.0000 mg | INTRAMUSCULAR | Status: AC
  Administered 2015-09-02 – 2017-02-09 (×5): 150 mg via INTRAMUSCULAR

## 2015-09-02 NOTE — Progress Notes (Signed)
Patient ID: Kathleen Rush, female   DOB: October 21, 1975, 40 y.o.   MRN: [id number] Pt states she has had the IUD for almost 1 year and it isnot helping her pain or amount of bleeding. She requests Rx for pain medication and wants to know if she needs a hysterectomy.

## 2015-09-02 NOTE — Progress Notes (Signed)
Patient ID: Kathleen Rush, female   DOB: July 13, 1975, 40 y.o.   MRN: [id number]  Chief Complaint  Patient presents with  . Follow-up    HPI Kathleen Rush is a 40 y.o. female with a history of hypertension, depression and BTL, who presents for removal of Mirena IUD and depo shot. Patient reports that ever since her BTL operation 9 years ago her periods became heavier and more painful. One year prior, she started using the IUD in hopes of mitigating the heavy bleeding, but found no improvement (perhaps even a worsening). She reports that her experience with the depo shot in the past was positive, largely eliminating her bleeding.   HPI  Past Medical History  Diagnosis Date  . Anemia   . Syncopal episodes OCT 2015    PT STATES SHE IS TOLD SHE PASSES OUT BUT THINKS SHE HAS SEIZURES?  Marland Kitchen. Anxiety   . Hypertension   . Eczema     Past Surgical History  Procedure Laterality Date  . Cesarean section    . Tubal ligation    . Bilateral carpal tunnel release Bilateral 10/14/2014    Procedure: BILATERAL CARPAL TUNNEL RELEASE;  Surgeon: Myra Rudehristopher Smith, MD;  Location: [facility] ORS;  Service: Orthopedics;  Laterality: Bilateral;    Family History  Problem Relation Age of Onset  . Diabetes Paternal Grandfather   . Diabetes Paternal Grandmother   . Diabetes Maternal Grandmother   . Diabetes Maternal Grandfather   . Hypertension Father   . Cancer Father     throat  . Hypertension Mother   . Heart disease Neg Hx   . Stroke Neg Hx   . COPD Neg Hx     Social History Social History  Substance Use Topics  . Smoking status: Former Smoker    Types: E-cigarettes    Quit date: 05/27/2015  . Smokeless tobacco: Never Used  . Alcohol Use: Yes     Comment: occasional beer    No Known Allergies  Current Outpatient Prescriptions  Medication Sig Dispense Refill  . amLODipine (NORVASC) 10 MG tablet TAKE 1 TABLET BY MOUTH EVERY DAY 30 tablet 2  . ferrous sulfate 325 (65 FE) MG tablet Take 1  tablet (325 mg total) by mouth 2 (two) times daily with a meal. 60 tablet 3  . FLUoxetine (PROZAC) 40 MG capsule TAKE ONE CAPSULE BY MOUTH EVERY DAY 30 capsule 2  . hydrALAZINE (APRESOLINE) 25 MG tablet TAKE 1 TABLET BY MOUTH THREE TIMES DAILY 90 tablet 5  . levonorgestrel (MIRENA) 20 MCG/24HR IUD 1 each by Intrauterine route once.    Marland Kitchen. lisinopril-hydrochlorothiazide (PRINZIDE,ZESTORETIC) 20-12.5 MG per tablet TAKE 2 TABLETS BY MOUTH EVERY MORNING 60 tablet 3  . metoprolol succinate (TOPROL-XL) 25 MG 24 hr tablet Take 1 tablet (25 mg total) by mouth daily. 30 tablet 3  . triamcinolone ointment (KENALOG) 0.1 % Apply 1 application topically every morning. Apply for eczema  0  . Multiple Vitamin (MULTIVITAMIN WITH MINERALS) TABS tablet Take 1 tablet by mouth daily. Reported on 09/02/2015    . traMADol (ULTRAM) 50 MG tablet Take 1 tablet (50 mg total) by mouth every 6 (six) hours as needed. (Patient not taking: Reported on 09/02/2015) 30 tablet 0   No current facility-administered medications for this visit.    Review of Systems Review of Systems  Genitourinary: Positive for vaginal bleeding and pelvic pain.      Blood pressure 160/97, pulse 64, resp. rate 20, height 5' (1.524 m), weight 259 lb (117.482 kg),  last menstrual period 08/08/2015.  Physical Exam Physical Exam  Cardiovascular: Normal rate, regular rhythm, S1 normal and S2 normal.   Pulmonary/Chest: Effort normal and breath sounds normal. No respiratory distress.  Genitourinary: No erythema, tenderness or bleeding in the vagina. No signs of injury around the vagina. No vaginal discharge found.  cervix and uterus probed with dressing forceps and IUD hook and unable to remove device   Data Reviewed   Assessment    [name] is a 40 yo with a history of HTN, BTL and depression with menorrhagia who presents for IUD removal and depo shot. We were unable to locate the IUD on speculum exam, and will therefore refer to Ultrasound to  confirm its continued presence in her uterus. She received a depo shot. We discussed her prior interest in a reversal of her BTL but today she no longer wished to proceed with referral to a reproductive endocrinologist as Medicaid would not pay and she recognized her chances of pregnancy would not be guaranteed.      Kathleen Rush 09/02/2015, 1:44 PM

## 2015-09-02 NOTE — Patient Instructions (Signed)
Intrauterine Device Information An intrauterine device (IUD) is inserted into your uterus to prevent pregnancy. There are two types of IUDs available:   Copper IUD--This type of IUD is wrapped in copper wire and is placed inside the uterus. Copper makes the uterus and fallopian tubes produce a fluid that kills sperm. The copper IUD can stay in place for 10 years.  Hormone IUD--This type of IUD contains the hormone progestin (synthetic progesterone). The hormone thickens the cervical mucus and prevents sperm from entering the uterus. It also thins the uterine lining to prevent implantation of a fertilized egg. The hormone can weaken or kill the sperm that get into the uterus. One type of hormone IUD can stay in place for 5 years, and another type can stay in place for 3 years. Your health care provider will make sure you are a good candidate for a contraceptive IUD. Discuss with your health care provider the possible side effects.  ADVANTAGES OF AN INTRAUTERINE DEVICE  IUDs are highly effective, reversible, long acting, and low maintenance.   There are no estrogen-related side effects.   An IUD can be used when breastfeeding.   IUDs are not associated with weight gain.   The copper IUD works immediately after insertion.   The hormone IUD works right away if inserted within 7 days of your period starting. You will need to use a backup method of birth control for 7 days if the hormone IUD is inserted at any other time in your cycle.  The copper IUD does not interfere with your female hormones.   The hormone IUD can make heavy menstrual periods lighter and decrease cramping.   The hormone IUD can be used for 3 or 5 years.   The copper IUD can be used for 10 years. DISADVANTAGES OF AN INTRAUTERINE DEVICE  The hormone IUD can be associated with irregular bleeding patterns.   The copper IUD can make your menstrual flow heavier and more painful.   You may experience cramping and  vaginal bleeding after insertion.    This information is not intended to replace advice given to you by your health care provider. Make sure you discuss any questions you have with your health care provider.   Document Released: 03/15/2004 Document Revised: 12/12/2012 Document Reviewed: 09/30/2012 Elsevier Interactive Patient Education 2016 Elsevier Inc.  

## 2015-09-03 ENCOUNTER — Ambulatory Visit (INDEPENDENT_AMBULATORY_CARE_PROVIDER_SITE_OTHER): Payer: Medicaid Other | Admitting: Family Medicine

## 2015-09-03 ENCOUNTER — Encounter: Payer: Self-pay | Admitting: Family Medicine

## 2015-09-03 VITALS — BP 138/82 | HR 72 | Temp 98.5°F | Resp 16 | Wt 259.0 lb

## 2015-09-03 DIAGNOSIS — I10 Essential (primary) hypertension: Secondary | ICD-10-CM | POA: Diagnosis not present

## 2015-09-03 DIAGNOSIS — L309 Dermatitis, unspecified: Secondary | ICD-10-CM | POA: Diagnosis not present

## 2015-09-03 DIAGNOSIS — M5416 Radiculopathy, lumbar region: Secondary | ICD-10-CM

## 2015-09-03 DIAGNOSIS — M5417 Radiculopathy, lumbosacral region: Secondary | ICD-10-CM | POA: Diagnosis not present

## 2015-09-03 DIAGNOSIS — D500 Iron deficiency anemia secondary to blood loss (chronic): Secondary | ICD-10-CM

## 2015-09-03 DIAGNOSIS — F419 Anxiety disorder, unspecified: Secondary | ICD-10-CM

## 2015-09-03 MED ORDER — TRAMADOL HCL 50 MG PO TABS: 50.0000 mg | ORAL_TABLET | Freq: Four times a day (QID) | ORAL | Status: DC | PRN

## 2015-09-03 MED ORDER — LORAZEPAM 1 MG PO TABS: ORAL_TABLET | ORAL | Status: DC

## 2015-09-03 MED ORDER — LISINOPRIL-HYDROCHLOROTHIAZIDE 20-12.5 MG PO TABS: 2.0000 | ORAL_TABLET | Freq: Every morning | ORAL | Status: DC

## 2015-09-03 MED ORDER — METOPROLOL SUCCINATE ER 25 MG PO TB24: 25.0000 mg | ORAL_TABLET | Freq: Every day | ORAL | Status: DC

## 2015-09-03 NOTE — Assessment & Plan Note (Signed)
Check on referral to Dr. Joice LoftsPoggi placed in March; order MRI; do NOT pick up anything more than 5 pounds; no twisting, no bending, no stooping; to ER if worsening

## 2015-09-03 NOTE — Assessment & Plan Note (Signed)
Check on prior referral to Dr. Alva Garnetyner
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
## 2015-09-03 NOTE — Assessment & Plan Note (Signed)
Check CBC, ferritin.

## 2015-09-03 NOTE — Assessment & Plan Note (Signed)
More anxious off of medicine; not depressed; start back on SSRI

## 2015-09-03 NOTE — Patient Instructions (Signed)
Please talk with my front staff about the referrals to bariatric surgery and back specialist We'll get MRI of your back See the paperwork for limitations for work Start back on the fluoxetine If any dark thoughts or increased anxiety, call me or get help right away Have labs done today

## 2015-09-03 NOTE — Assessment & Plan Note (Signed)
Refills given; try DASH guidelines

## 2015-09-04 ENCOUNTER — Telehealth: Payer: Self-pay | Admitting: Family Medicine

## 2015-09-04 DIAGNOSIS — M5416 Radiculopathy, lumbar region: Secondary | ICD-10-CM

## 2015-09-04 LAB — CBC WITH DIFFERENTIAL/PLATELET
BASOS ABS: 0 10*3/uL (ref 0.0–0.2)
BASOS: 1 %
EOS (ABSOLUTE): 0.2 10*3/uL (ref 0.0–0.4)
Eos: 3 %
Hematocrit: 34.6 % (ref 34.0–46.6)
Hemoglobin: 11 g/dL — ABNORMAL LOW (ref 11.1–15.9)
IMMATURE GRANS (ABS): 0 10*3/uL (ref 0.0–0.1)
Immature Granulocytes: 0 %
LYMPHS ABS: 2.2 10*3/uL (ref 0.7–3.1)
LYMPHS: 37 %
MCH: 25.1 pg — AB (ref 26.6–33.0)
MCHC: 31.8 g/dL (ref 31.5–35.7)
MCV: 79 fL (ref 79–97)
MONOS ABS: 0.6 10*3/uL (ref 0.1–0.9)
Monocytes: 9 %
NEUTROS ABS: 3 10*3/uL (ref 1.4–7.0)
Neutrophils: 50 %
PLATELETS: 335 10*3/uL (ref 150–379)
RBC: 4.38 x10E6/uL (ref 3.77–5.28)
RDW: 17.5 % — ABNORMAL HIGH (ref 12.3–15.4)
WBC: 6.1 10*3/uL (ref 3.4–10.8)

## 2015-09-04 LAB — COMPREHENSIVE METABOLIC PANEL
A/G RATIO: 1.4 (ref 1.2–2.2)
ALBUMIN: 4 g/dL (ref 3.5–5.5)
ALK PHOS: 48 IU/L (ref 39–117)
ALT: 10 IU/L (ref 0–32)
AST: 16 IU/L (ref 0–40)
BILIRUBIN TOTAL: 0.2 mg/dL (ref 0.0–1.2)
BUN / CREAT RATIO: 12 (ref 9–23)
BUN: 10 mg/dL (ref 6–24)
CHLORIDE: 100 mmol/L (ref 96–106)
CO2: 22 mmol/L (ref 18–29)
Calcium: 8.7 mg/dL (ref 8.7–10.2)
Creatinine, Ser: 0.83 mg/dL (ref 0.57–1.00)
GFR calc non Af Amer: 88 mL/min/{1.73_m2} (ref >59)
GFR, EST AFRICAN AMERICAN: 102 mL/min/{1.73_m2} (ref >59)
GLOBULIN, TOTAL: 2.8 g/dL (ref 1.5–4.5)
GLUCOSE: 92 mg/dL (ref 65–99)
POTASSIUM: 4.5 mmol/L (ref 3.5–5.2)
SODIUM: 137 mmol/L (ref 134–144)
TOTAL PROTEIN: 6.8 g/dL (ref 6.0–8.5)

## 2015-09-04 LAB — VITAMIN D 25 HYDROXY (VIT D DEFICIENCY, FRACTURES): VIT D 25 HYDROXY: 15.3 ng/mL — AB (ref 30.0–100.0)

## 2015-09-04 LAB — FERRITIN: FERRITIN: 10 ng/mL — AB (ref 15–150)

## 2015-09-04 MED ORDER — VITAMIN D (ERGOCALCIFEROL) 1.25 MG (50000 UNIT) PO CAPS: 50000.0000 [IU] | ORAL_CAPSULE | ORAL | Status: DC

## 2015-09-04 NOTE — Telephone Encounter (Signed)
Please let pt know that insurance denied the MRI We'll refer her to see a back specialist

## 2015-09-04 NOTE — Assessment & Plan Note (Signed)
Refer to back specialist; insurance denied MRI

## 2015-09-04 NOTE — Telephone Encounter (Signed)
Pt.notified

## 2015-09-11 ENCOUNTER — Ambulatory Visit (HOSPITAL_COMMUNITY): Payer: Medicaid Other

## 2015-09-15 ENCOUNTER — Telehealth: Payer: Self-pay | Admitting: Family Medicine

## 2015-09-15 NOTE — Telephone Encounter (Signed)
Pt states she received the latter stating Medicaid denied her MRI and pt would like to know what else she can do about her back. Also, she has not received a call about bariatric.

## 2015-09-15 NOTE — Telephone Encounter (Signed)
Notified patient of phone #'s [place], and [place] [facility]

## 2015-09-18 ENCOUNTER — Ambulatory Visit: Payer: Medicaid Other

## 2015-09-23 ENCOUNTER — Ambulatory Visit (HOSPITAL_COMMUNITY): Payer: Medicaid Other

## 2015-09-30 ENCOUNTER — Ambulatory Visit: Payer: Medicaid Other | Admitting: Obstetrics & Gynecology

## 2015-10-05 ENCOUNTER — Ambulatory Visit: Payer: Medicaid Other | Admitting: Family Medicine

## 2015-10-13 ENCOUNTER — Telehealth: Payer: Self-pay | Admitting: *Deleted

## 2015-10-13 NOTE — Telephone Encounter (Signed)
Pt left message stating that she is still bleeding and having cramps after receiving Depo injection. Please call back

## 2015-10-23 ENCOUNTER — Other Ambulatory Visit: Payer: Self-pay

## 2015-10-23 MED ORDER — MEGESTROL ACETATE 40 MG PO TABS: 40.0000 mg | ORAL_TABLET | Freq: Two times a day (BID) | ORAL | Status: DC

## 2015-10-23 NOTE — Telephone Encounter (Signed)
Spoke with patient who stated she has been bleeding for 1.5 month. Patient was started on depo-provera in May and the bleeding has continued. Per [facility] we can call in Megace 40 mg to help with bleeding. Patient should follow up in 4 weeks to be seen.

## 2015-10-29 ENCOUNTER — Encounter: Payer: Self-pay | Admitting: Family Medicine

## 2015-11-03 ENCOUNTER — Encounter: Payer: Self-pay | Admitting: Family Medicine

## 2015-11-11 MED ORDER — LIRAGLUTIDE -WEIGHT MANAGEMENT 18 MG/3ML ~~LOC~~ SOPN: 0.6000 mg | PEN_INJECTOR | Freq: Every day | SUBCUTANEOUS | Status: DC

## 2015-11-11 NOTE — Telephone Encounter (Addendum)
She has tried phentermine She is interested in other medicine; no fam hx of MEN-2, no hx of MTC She will try Saxenda; return 4 weeks after starting Saxenda; record starting weight Refer to nutritionist discussed; she says that she can go there with bariatric surgeon at that office Current weight, 260 pounds 2000 calorie a day diet; myfitnesspal or calorie counter Start 10-15 minutes a day five days a week and build up to 150 minutes per week ------------------------------- Kathleen Rush, please notify bariatric office that we're starting reduced calorie diet, walking program, and Saxenda but we still want to proceed with bariatric surgery as option for patient; just wanting them to know what therapy we've started

## 2015-11-11 NOTE — Addendum Note (Signed)
Addended by: LADA, Janit BernMELINDA P on: 11/11/2015 03:56 PM   Modules accepted: Orders

## 2015-11-11 NOTE — Telephone Encounter (Signed)
i called patient to discuss these issues

## 2015-12-08 ENCOUNTER — Encounter: Payer: Self-pay | Admitting: Obstetrics & Gynecology

## 2015-12-09 ENCOUNTER — Ambulatory Visit (INDEPENDENT_AMBULATORY_CARE_PROVIDER_SITE_OTHER): Payer: Medicaid Other

## 2015-12-09 VITALS — BP 169/104 | Wt 255.5 lb

## 2015-12-09 DIAGNOSIS — Z3042 Encounter for surveillance of injectable contraceptive: Secondary | ICD-10-CM | POA: Diagnosis not present

## 2015-12-09 MED ORDER — MEDROXYPROGESTERONE ACETATE 150 MG/ML IM SUSP: 150.0000 mg | Freq: Once | INTRAMUSCULAR | Status: DC

## 2015-12-09 NOTE — Progress Notes (Signed)
Patient presented for

## 2015-12-10 ENCOUNTER — Ambulatory Visit: Payer: Medicaid Other

## 2015-12-10 LAB — POCT PREGNANCY, URINE: PREG TEST UR: NEGATIVE

## 2015-12-15 ENCOUNTER — Encounter: Payer: Self-pay | Admitting: Family Medicine

## 2015-12-15 ENCOUNTER — Ambulatory Visit (INDEPENDENT_AMBULATORY_CARE_PROVIDER_SITE_OTHER): Payer: Medicaid Other | Admitting: Family Medicine

## 2015-12-15 DIAGNOSIS — R252 Cramp and spasm: Secondary | ICD-10-CM

## 2015-12-15 DIAGNOSIS — I10 Essential (primary) hypertension: Secondary | ICD-10-CM

## 2015-12-15 DIAGNOSIS — F419 Anxiety disorder, unspecified: Secondary | ICD-10-CM | POA: Diagnosis not present

## 2015-12-15 MED ORDER — LIRAGLUTIDE -WEIGHT MANAGEMENT 18 MG/3ML ~~LOC~~ SOPN: 0.6000 mg | PEN_INJECTOR | Freq: Every day | SUBCUTANEOUS | 0 refills | Status: DC

## 2015-12-15 MED ORDER — LISINOPRIL-HYDROCHLOROTHIAZIDE 20-12.5 MG PO TABS: 2.0000 | ORAL_TABLET | Freq: Every morning | ORAL | 1 refills | Status: AC

## 2015-12-15 MED ORDER — METOPROLOL SUCCINATE ER 50 MG PO TB24: 50.0000 mg | ORAL_TABLET | Freq: Every day | ORAL | 3 refills | Status: AC

## 2015-12-15 MED ORDER — HYDRALAZINE HCL 25 MG PO TABS: 25.0000 mg | ORAL_TABLET | Freq: Three times a day (TID) | ORAL | 1 refills | Status: DC

## 2015-12-15 NOTE — Patient Instructions (Addendum)
Start the saxenda Work with the pharmacist to learn how to do the injection  Increase the metoprolol from 25 mg daily to 50 mg daily Continue the other medicines  Your goal blood pressure is less than 140 mmHg on top. Try to follow the DASH guidelines (DASH stands for Dietary Approaches to Stop Hypertension) Try to limit the sodium in your diet.  Ideally, consume less than 1.5 grams (less than 1,500mg ) per day. Do not add salt when cooking or at the table.  Check the sodium amount on labels when shopping, and choose items lower in sodium when given a choice. Avoid or limit foods that already contain a lot of sodium. Eat a diet rich in fruits and vegetables and whole grains.  Either try magnesium-rich foods or take magnesium oxide 250 mg daily; if the cramps continue despite that, contact your surgeon  12 Ways to Curb Anxiety  ?Anxiety is normal human sensation. It is what helped our ancestors survive the pitfalls of the wilderness. Anxiety is defined as experiencing worry or nervousness about an imminent event or something with an uncertain outcome. It is a feeling experienced by most people at some point in their lives. Anxiety can be triggered by a very personal issue, such as the illness of a loved one, or an event of global proportions, such as a refugee crisis. Some of the symptoms of anxiety are:  Feeling restless.  Having a feeling of impending danger.  Increased heart rate.  Rapid breathing. Sweating.  Shaking.  Weakness or feeling tired.  Difficulty concentrating on anything except the current worry.  Insomnia.  Stomach or bowel problems. What can we do about anxiety we may be feeling? There are many techniques to help manage stress and relax. Here are 12 ways you can reduce your anxiety almost immediately: 1. Turn off the constant feed of information. Take a social media sabbatical. Studies have shown that social media directly contributes to social anxiety.  2. Monitor your  television viewing habits. Are you watching shows that are also contributing to your anxiety, such as 24-hour news stations? Try watching something else, or better yet, nothing at all. Instead, listen to music, read an inspirational book or practice a hobby. 3. Eat nutritious meals. Also, don't skip meals and keep healthful snacks on hand. Hunger and poor diet contributes to feeling anxious. 4. Sleep. Sleeping on a regular schedule for at least seven to eight hours a night will do wonders for your outlook when you are awake. 5. Exercise. Regular exercise will help rid your body of that anxious energy and help you get more restful sleep. 6. Try deep (diaphragmatic) breathing. Inhale slowly through your nose for five seconds and exhale through your mouth. 7. Practice acceptance and gratitude. When anxiety hits, accept that there are things out of your control that shouldn't be of immediate concern.  8. Seek out humor. When anxiety strikes, watch a funny video, read jokes or call a friend who makes you laugh. Laughter is healing for our bodies and releases endorphins that are calming. 9. Stay positive. Take the effort to replace negative thoughts with positive ones. Try to see a stressful situation in a positive light. Try to come up with solutions rather than dwelling on the problem. 10. Figure out what triggers your anxiety. Keep a journal and make note of anxious moments and the events surrounding them. This will help you identify triggers you can avoid or even eliminate. 11. Talk to someone. Let a trusted friend,  family member or even trained professional know that you are feeling overwhelmed and anxious. Verbalize what you are feeling and why.  12. Volunteer. If your anxiety is triggered by a crisis on a large scale, become an advocate and work to resolve the problem that is causing you unease. Anxiety is often unwelcome and can become overwhelming. If not kept in check, it can become a disorder that  could require medical treatment. However, if you take the time to care for yourself and avoid the triggers that make you anxious, you will be able to find moments of relaxation and clarity that make your life much more enjoyable.

## 2015-12-15 NOTE — Progress Notes (Signed)
BP (!) 170/108   Pulse 74   Temp 98.4 F (36.9 C) (Oral)   Resp 16   Wt 252 lb (114.3 kg)   SpO2 97%   BMI 49.22 kg/m    Subjective:    Patient ID: Kathleen Rush, female    DOB: 1975-11-03, 40 y.o.   MRN: [id number]  HPI: Kathleen Rush is a 40 y.o. female  Chief Complaint  Patient presents with  . Advice Only    weightloss medication   She is here for obesity She is interested in talking about Saxenda Her BP is sky high, her husband is incarcerated; they've only been married a month, and she had to visit him in jail on their one month anniversary; she discussed the cost of his bond, they don't have the money to get him out, lots of stress right now Eating habits are terrible She is super stressed; tired constantly Went to the GYN last week and BP was 169/130 She has not been taking her BP medicine, "I've been a bad girl"; she took her BP medicine last night though  Depression screen [facility] 2/9 12/15/2015 09/03/2015  Decreased Interest 1 2  Down, Depressed, Hopeless 1 1  PHQ - 2 Score 2 3  Altered sleeping 1 2  Tired, decreased energy 1 2  Change in appetite 1 3  Feeling bad or failure about yourself  0 1  Trouble concentrating 0 0  Moving slowly or fidgety/restless 0 0  Suicidal thoughts 0 0  PHQ-9 Score 5 11  Difficult doing work/chores Not difficult at all Not difficult at all   Relevant past medical, surgical, family and social history reviewed Past Medical History:  Diagnosis Date  . Anemia   . Anxiety   . Eczema   . Hypertension   . Syncopal episodes OCT 2015   PT STATES SHE IS TOLD SHE PASSES OUT BUT THINKS SHE HAS SEIZURES?   Past Surgical History:  Procedure Laterality Date  . BILATERAL CARPAL TUNNEL RELEASE Bilateral 10/14/2014   Procedure: BILATERAL CARPAL TUNNEL RELEASE;  Surgeon: Myra Rudehristopher Smith, MD;  Location: [facility] ORS;  Service: Orthopedics;  Laterality: Bilateral;  . CESAREAN SECTION    . TUBAL LIGATION     Family History  Problem  Relation Age of Onset  . Diabetes Paternal Grandfather   . Diabetes Paternal Grandmother   . Diabetes Maternal Grandmother   . Diabetes Maternal Grandfather   . Hypertension Father   . Cancer Father     throat  . Hypertension Mother   . Heart disease Neg Hx   . Stroke Neg Hx   . COPD Neg Hx    Social History  Substance Use Topics  . Smoking status: Current Every Day Smoker    Types: E-cigarettes    Last attempt to quit: 05/27/2015  . Smokeless tobacco: Never Used  . Alcohol use Yes     Comment: occasional beer   Interim medical history since last visit reviewed. Allergies and medications reviewed  Review of Systems  Constitutional: Negative for unexpected weight change.  Musculoskeletal:       Muscle cramps in hands after carpal tunnel surgery  Psychiatric/Behavioral: The patient is nervous/anxious.    Per HPI unless specifically indicated above     Objective:    BP (!) 170/108   Pulse 74   Temp 98.4 F (36.9 C) (Oral)   Resp 16   Wt 252 lb (114.3 kg)   SpO2 97%   BMI 49.22 kg/m   Wt  Readings from Last 3 Encounters:  12/15/15 252 lb (114.3 kg)  12/09/15 255 lb 8 oz (115.9 kg)  09/03/15 259 lb (117.5 kg)    Physical Exam  Constitutional: She appears well-developed and well-nourished.  Morbidly obese, but weight down 7 pounds over last 3+ months  HENT:  Head: Normocephalic and atraumatic.  Eyes: EOM are normal. No scleral icterus.  Cardiovascular: Normal rate and regular rhythm.   Pulmonary/Chest: Effort normal and breath sounds normal.  Neurological: She is alert. She displays no tremor.  Skin: She is not diaphoretic. No pallor.  No facial flushing  Psychiatric: Her mood appears anxious. Her affect is not angry, not blunt and not inappropriate. Her speech is not rapid and/or pressured and not delayed. She is not aggressive, not hyperactive and not withdrawn. Cognition and memory are not impaired. She does not express impulsivity or inappropriate judgment. She  does not exhibit a depressed mood. She expresses no homicidal ideation. She is attentive.   Results for orders placed or performed in visit on 12/09/15  Pregnancy, urine POC  Result Value Ref Range   Preg Test, Ur NEGATIVE NEGATIVE      Assessment & Plan:   Problem List Items Addressed This Visit      Cardiovascular and Mediastinum   Hypertension    She agrees to get back on medicine regularly; work on stress reduction; see AVS for ways to cope with anxiety; supportive listening; increase beta-blocker; return in one week for f/u      Relevant Medications   metoprolol succinate (TOPROL-XL) 50 MG 24 hr tablet   lisinopril-hydrochlorothiazide (PRINZIDE,ZESTORETIC) 20-12.5 MG tablet   hydrALAZINE (APRESOLINE) 25 MG tablet     Other   Muscle cramps    Hands; suggested magnesium-rich foods or OTC magnesium; contact surgeon if persistent and only happening distal to surgical site      Morbid obesity (HCC)    Discussed risks of Saxenda; she denies family hx of MEN-2; denies personal hx of medullary thyroid carcinoma; risk of pancreatitis, reasons to stop and seek immediate medical attention reviewed; start daily injection and taper up week by week; combine with smart eating, activity; see AVS      Relevant Medications   Liraglutide -Weight Management (SAXENDA) 18 MG/3ML SOPN   Anxiety    Supportive listening regarding the stress she is going through; see AVS for ideas on coping with anxiety; encouragement given       Other Visit Diagnoses   None.      Follow up plan: Return in about 1 week (around 12/22/2015) for hypertension.  An after-visit summary was printed and given to the patient at check-out.  Please see the patient instructions which may contain other information and recommendations beyond what is mentioned above in the assessment and plan.  Meds ordered this encounter  Medications  . metoprolol succinate (TOPROL-XL) 50 MG 24 hr tablet    Sig: Take 1 tablet (50 mg  total) by mouth daily. Take with or immediately following a meal.    Dispense:  90 tablet    Refill:  3    Changing dose from 25 mg to 50 mg daily  . lisinopril-hydrochlorothiazide (PRINZIDE,ZESTORETIC) 20-12.5 MG tablet    Sig: Take 2 tablets by mouth every morning.    Dispense:  180 tablet    Refill:  1  . hydrALAZINE (APRESOLINE) 25 MG tablet    Sig: Take 1 tablet (25 mg total) by mouth 3 (three) times daily.    Dispense:  270  tablet    Refill:  1  . DISCONTD: Liraglutide -Weight Management (SAXENDA) 18 MG/3ML SOPN    Sig: Inject 0.6 mg into the skin daily. x 1 week, then 1.2 mg daily x 1 week, then 1.8 mg daily x 1 week, then 2.4 mg daily x 1 week, then 3 mg daily    Dispense:  15 mL    Refill:  0  . Liraglutide -Weight Management (SAXENDA) 18 MG/3ML SOPN    Sig: Inject 0.6 mg into the skin daily. x 1 week, then 1.2 mg daily x 1 week, then 1.8 mg daily x 1 week, then 2.4 mg daily x 1 week, then 3 mg daily    Dispense:  15 mL    Refill:  0    No orders of the defined types were placed in this encounter.

## 2015-12-17 DIAGNOSIS — R252 Cramp and spasm: Secondary | ICD-10-CM | POA: Insufficient documentation

## 2015-12-17 NOTE — Assessment & Plan Note (Signed)
Discussed risks of Saxenda; she denies family hx of MEN-2; denies personal hx of medullary thyroid carcinoma; risk of pancreatitis, reasons to stop and seek immediate medical attention reviewed; start daily injection and taper up week by week; combine with smart eating, activity; see AVS

## 2015-12-17 NOTE — Assessment & Plan Note (Signed)
Hands; suggested magnesium-rich foods or OTC magnesium; contact surgeon if persistent and only happening distal to surgical site

## 2015-12-17 NOTE — Assessment & Plan Note (Signed)
Supportive listening regarding the stress she is going through; see AVS for ideas on coping with anxiety; encouragement given

## 2015-12-17 NOTE — Assessment & Plan Note (Signed)
She agrees to get back on medicine regularly; work on stress reduction; see AVS for ways to cope with anxiety; supportive listening; increase beta-blocker; return in one week for f/u

## 2015-12-18 ENCOUNTER — Encounter: Payer: Self-pay | Admitting: Family Medicine

## 2015-12-21 MED ORDER — INSULIN PEN NEEDLE 31G X 5 MM MISC: 1 refills | Status: DC

## 2015-12-21 MED ORDER — LIRAGLUTIDE -WEIGHT MANAGEMENT 18 MG/3ML ~~LOC~~ SOPN: 0.6000 mg | PEN_INJECTOR | Freq: Every day | SUBCUTANEOUS | 0 refills | Status: DC

## 2015-12-22 ENCOUNTER — Telehealth: Payer: Self-pay | Admitting: Family Medicine

## 2015-12-22 NOTE — Telephone Encounter (Signed)
Please let patient know that Medicaid apparently does not cover any weight loss medications We'll have her continue to focus on healthy eating, portion control, drinking 64-72 ounces of water a day, and increasing her activity

## 2015-12-23 ENCOUNTER — Ambulatory Visit: Payer: Medicaid Other | Admitting: Family Medicine

## 2015-12-23 NOTE — Telephone Encounter (Signed)
Left voice mail

## 2016-01-19 ENCOUNTER — Ambulatory Visit: Payer: Medicaid Other | Admitting: Family Medicine

## 2016-01-21 ENCOUNTER — Ambulatory Visit: Payer: Medicaid Other | Admitting: Family Medicine

## 2016-01-22 ENCOUNTER — Ambulatory Visit (INDEPENDENT_AMBULATORY_CARE_PROVIDER_SITE_OTHER): Payer: Medicaid Other | Admitting: Family Medicine

## 2016-01-22 ENCOUNTER — Encounter: Payer: Self-pay | Admitting: Family Medicine

## 2016-01-22 VITALS — BP 152/108 | HR 80 | Temp 98.5°F | Resp 14 | Wt 258.0 lb

## 2016-01-22 DIAGNOSIS — I10 Essential (primary) hypertension: Secondary | ICD-10-CM | POA: Diagnosis not present

## 2016-01-22 DIAGNOSIS — E876 Hypokalemia: Secondary | ICD-10-CM

## 2016-01-22 DIAGNOSIS — Z5181 Encounter for therapeutic drug level monitoring: Secondary | ICD-10-CM

## 2016-01-22 DIAGNOSIS — D500 Iron deficiency anemia secondary to blood loss (chronic): Secondary | ICD-10-CM

## 2016-01-22 DIAGNOSIS — Z23 Encounter for immunization: Secondary | ICD-10-CM

## 2016-01-22 LAB — COMPLETE METABOLIC PANEL WITH GFR
ALT: 15 U/L (ref 6–29)
AST: 18 U/L (ref 10–30)
Albumin: 3.9 g/dL (ref 3.6–5.1)
Alkaline Phosphatase: 43 U/L (ref 33–115)
BUN: 8 mg/dL (ref 7–25)
CALCIUM: 8.8 mg/dL (ref 8.6–10.2)
CHLORIDE: 106 mmol/L (ref 98–110)
CO2: 27 mmol/L (ref 20–31)
Creat: 0.86 mg/dL (ref 0.50–1.10)
GFR, Est African American: >89 mL/min (ref >=60)
GFR, Est Non African American: 85 mL/min (ref >=60)
Glucose, Bld: 110 mg/dL — ABNORMAL HIGH (ref 65–99)
POTASSIUM: 4 mmol/L (ref 3.5–5.3)
Sodium: 140 mmol/L (ref 135–146)
Total Bilirubin: 0.5 mg/dL (ref 0.2–1.2)
Total Protein: 6.9 g/dL (ref 6.1–8.1)

## 2016-01-22 LAB — CBC WITH DIFFERENTIAL/PLATELET
BASOS ABS: 51 {cells}/uL (ref 0–200)
BASOS PCT: 1 %
EOS ABS: 102 {cells}/uL (ref 15–500)
Eosinophils Relative: 2 %
HEMATOCRIT: 37.7 % (ref 35.0–45.0)
HEMOGLOBIN: 12.2 g/dL (ref 11.7–15.5)
LYMPHS ABS: 1581 {cells}/uL (ref 850–3900)
Lymphocytes Relative: 31 %
MCH: 25.4 pg — AB (ref 27.0–33.0)
MCHC: 32.4 g/dL (ref 32.0–36.0)
MCV: 78.5 fL — AB (ref 80.0–100.0)
MONO ABS: 408 {cells}/uL (ref 200–950)
MPV: 10 fL (ref 7.5–12.5)
Monocytes Relative: 8 %
NEUTROS ABS: 2958 {cells}/uL (ref 1500–7800)
Neutrophils Relative %: 58 %
Platelets: 298 10*3/uL (ref 140–400)
RBC: 4.8 MIL/uL (ref 3.80–5.10)
RDW: 18.4 % — ABNORMAL HIGH (ref 11.0–15.0)
WBC: 5.1 10*3/uL (ref 3.8–10.8)

## 2016-01-22 LAB — FERRITIN: Ferritin: 14 ng/mL (ref 10–232)

## 2016-01-22 MED ORDER — HYDRALAZINE HCL 50 MG PO TABS: 50.0000 mg | ORAL_TABLET | Freq: Three times a day (TID) | ORAL | 1 refills | Status: AC

## 2016-01-22 NOTE — Progress Notes (Signed)
BP (!) 152/108   Pulse 80   Temp 98.5 F (36.9 C) (Oral)   Resp 14   Wt 258 lb (117 kg)   SpO2 98%   BMI 50.39 kg/m    Subjective:    Patient ID: Kathleen Rush, female    DOB: Jun 24, 1975, 40 y.o.   MRN: [id number]  HPI: Kathleen Rush is a 40 y.o. female  Chief Complaint  Patient presents with  . Hypertension  . Medication Refill   Patient is here for high blood pressure; last BP was 170/108 She is taking amlodipine, lisinopril/HCTZ, hydralazine 25, metoprolol She says her weight is going up and down; she is going to [place], has not been active Iron deficiency anemia; feeling tired; getting iron in foods; taking iron pills Working with GYN to stop bleeding; going to see someone locally so she doesn't have to drive to [place] Husband is home now; some stress relieved now HTN runs strongly in the family  Relevant past medical, surgical, family and social history reviewed Past Medical History:  Diagnosis Date  . Anemia   . Anxiety   . Eczema   . Hypertension   . Syncopal episodes OCT 2015   PT STATES SHE IS TOLD SHE PASSES OUT BUT THINKS SHE HAS SEIZURES?   Past Surgical History:  Procedure Laterality Date  . BILATERAL CARPAL TUNNEL RELEASE Bilateral 10/14/2014   Procedure: BILATERAL CARPAL TUNNEL RELEASE;  Surgeon: Myra Rudehristopher Smith, MD;  Location: [facility] ORS;  Service: Orthopedics;  Laterality: Bilateral;  . CESAREAN SECTION    . TUBAL LIGATION     Family History  Problem Relation Age of Onset  . Hypertension Father   . Cancer Father     throat  . Hypertension Mother   . Diabetes Paternal Grandfather   . Diabetes Paternal Grandmother   . Diabetes Maternal Grandmother   . Diabetes Maternal Grandfather   . Heart disease Neg Hx   . Stroke Neg Hx   . COPD Neg Hx    Social History  Substance Use Topics  . Smoking status: Current Some Day Smoker    Types: E-cigarettes    Last attempt to quit: 05/27/2015  . Smokeless tobacco: Never Used  . Alcohol use Yes      Comment: occasional beer    Interim medical history since last visit reviewed. Allergies and medications reviewed  Review of Systems Per HPI unless specifically indicated above     Objective:    BP (!) 152/108   Pulse 80   Temp 98.5 F (36.9 C) (Oral)   Resp 14   Wt 258 lb (117 kg)   SpO2 98%   BMI 50.39 kg/m   Wt Readings from Last 3 Encounters:  01/22/16 258 lb (117 kg)  12/15/15 252 lb (114.3 kg)  12/09/15 255 lb 8 oz (115.9 kg)    Physical Exam  Constitutional: She appears well-developed and well-nourished. No distress.  Eyes: EOM are normal. No scleral icterus.  Neck: No thyromegaly present.  Cardiovascular: Normal rate and regular rhythm.   Pulmonary/Chest: Effort normal and breath sounds normal.  Musculoskeletal: She exhibits no edema.  Skin: No pallor.  Psychiatric: She has a normal mood and affect. Her behavior is normal. Judgment and thought content normal.    Results for orders placed or performed in visit on 12/09/15  Pregnancy, urine POC  Result Value Ref Range   Preg Test, Ur NEGATIVE NEGATIVE      Assessment & Plan:   Problem List Items Addressed This  Visit      Cardiovascular and Mediastinum   Hypertension - Primary    Continue meds but increase hydralazine; if not controlled then refer to nephrologist; weight loss and DASH guidelines; recheck 3 weeks      Relevant Medications   hydrALAZINE (APRESOLINE) 50 MG tablet   Other Relevant Orders   Aldosterone + renin activity w/ ratio     Other   Morbid obesity (HCC)    Encouraged weight loss, hydration, avoid empty calories, consider increasing activity and start slow and build up gradually      Medication monitoring encounter    Check labs      Relevant Orders   COMPLETE METABOLIC PANEL WITH GFR   Iron deficiency anemia due to chronic blood loss    Continue iron-rich foods; check CBC and ferritin today      Relevant Orders   CBC with Differential/Platelet   Ferritin    Hypokalemia    Recheck labs      Hypocalcemia    Recheck labs       Other Visit Diagnoses    Needs flu shot       Relevant Orders   Flu Vaccine QUAD 36+ mos PF IM (Fluarix & Fluzone Quad PF) (Completed)      Follow up plan: Return in about 3 weeks (around 02/12/2016) for follow-up.  An after-visit summary was printed and given to the patient at check-out.  Please see the patient instructions which may contain other information and recommendations beyond what is mentioned above in the assessment and plan.  Meds ordered this encounter  Medications  . ELIDEL 1 % cream    Sig: APP AA OF FACE BID UNTIL CLEAR    Refill:  1  . hydrALAZINE (APRESOLINE) 50 MG tablet    Sig: Take 1 tablet (50 mg total) by mouth 3 (three) times daily.    Dispense:  270 tablet    Refill:  1    Increasing dose    Orders Placed This Encounter  Procedures  . Flu Vaccine QUAD 36+ mos PF IM (Fluarix & Fluzone Quad PF)  . CBC with Differential/Platelet  . Ferritin  . COMPLETE METABOLIC PANEL WITH GFR  . Aldosterone + renin activity w/ ratio

## 2016-01-22 NOTE — Assessment & Plan Note (Signed)
Check labs 

## 2016-01-22 NOTE — Assessment & Plan Note (Signed)
Recheck labs 

## 2016-01-22 NOTE — Assessment & Plan Note (Signed)
Encouraged weight loss, hydration, avoid empty calories, consider increasing activity and start slow and build up gradually

## 2016-01-22 NOTE — Patient Instructions (Addendum)
Increase hydralazine to 50 mg three times a day Continue the other medicines Try the DASH guidelines Really work on weight loss with healthy eating and increasing activity gradually Check out the information at familydoctor.org entitled "Nutrition for Weight Loss: What You Need to Know about Fad Diets" Try to lose between 1-2 pounds per week by taking in fewer calories and burning off more calories You can succeed by limiting portions, limiting foods dense in calories and fat, becoming more active, and drinking 8 glasses of water a day (64 ounces) Don't skip meals, especially breakfast, as skipping meals may alter your metabolism Do not use over-the-counter weight loss pills or gimmicks that claim rapid weight loss A healthy BMI (or body mass index) is between 18.5 and 24.9 You can calculate your ideal BMI at the NIH website JobEconomics.hu  DASH Eating Plan DASH stands for "Dietary Approaches to Stop Hypertension." The DASH eating plan is a healthy eating plan that has been shown to reduce high blood pressure (hypertension). Additional health benefits may include reducing the risk of type 2 diabetes mellitus, heart disease, and stroke. The DASH eating plan may also help with weight loss. WHAT DO I NEED TO KNOW ABOUT THE DASH EATING PLAN? For the DASH eating plan, you will follow these general guidelines:  Choose foods with a percent daily value for sodium of less than 5% (as listed on the food label).  Use salt-free seasonings or herbs instead of table salt or sea salt.  Check with your health care provider or pharmacist before using salt substitutes.  Eat lower-sodium products, often labeled as "lower sodium" or "no salt added."  Eat fresh foods.  Eat more vegetables, fruits, and low-fat dairy products.  Choose whole grains. Look for the word "whole" as the first word in the ingredient list.  Choose fish and skinless chicken or  [place] more often than red meat. Limit fish, poultry, and meat to 6 oz (170 g) each day.  Limit sweets, desserts, sugars, and sugary drinks.  Choose heart-healthy fats.  Limit cheese to 1 oz (28 g) per day.  Eat more home-cooked food and less restaurant, buffet, and fast food.  Limit fried foods.  Cook foods using methods other than frying.  Limit canned vegetables. If you do use them, rinse them well to decrease the sodium.  When eating at a restaurant, ask that your food be prepared with less salt, or no salt if possible. WHAT FOODS CAN I EAT? Seek help from a dietitian for individual calorie needs. Grains Whole grain or whole wheat bread. Brown rice. Whole grain or whole wheat pasta. Quinoa, bulgur, and whole grain cereals. Low-sodium cereals. Corn or whole wheat flour tortillas. Whole grain cornbread. Whole grain crackers. Low-sodium crackers. Vegetables Fresh or frozen vegetables (raw, steamed, roasted, or grilled). Low-sodium or reduced-sodium tomato and vegetable juices. Low-sodium or reduced-sodium tomato sauce and paste. Low-sodium or reduced-sodium canned vegetables.  Fruits All fresh, canned (in natural juice), or frozen fruits. Meat and Other Protein Products Ground beef (85% or leaner), grass-fed beef, or beef trimmed of fat. Skinless chicken or [place]. Ground chicken or [place]. Pork trimmed of fat. All fish and seafood. Eggs. Dried beans, peas, or lentils. Unsalted nuts and seeds. Unsalted canned beans. Dairy Low-fat dairy products, such as skim or 1% milk, 2% or reduced-fat cheeses, low-fat ricotta or cottage cheese, or plain low-fat yogurt. Low-sodium or reduced-sodium cheeses. Fats and Oils Tub margarines without trans fats. Light or reduced-fat mayonnaise and salad dressings (reduced sodium). Avocado.  Safflower, olive, or canola oils. Natural peanut or almond butter. Other Unsalted popcorn and pretzels. The items listed above may not be a complete list of  recommended foods or beverages. Contact your dietitian for more options. WHAT FOODS ARE NOT RECOMMENDED? Grains White bread. White pasta. White rice. Refined cornbread. Bagels and croissants. Crackers that contain trans fat. Vegetables Creamed or fried vegetables. Vegetables in a cheese sauce. Regular canned vegetables. Regular canned tomato sauce and paste. Regular tomato and vegetable juices. Fruits Dried fruits. Canned fruit in light or heavy syrup. Fruit juice. Meat and Other Protein Products Fatty cuts of meat. Ribs, chicken wings, bacon, sausage, bologna, salami, chitterlings, fatback, hot dogs, bratwurst, and packaged luncheon meats. Salted nuts and seeds. Canned beans with salt. Dairy Whole or 2% milk, cream, half-and-half, and cream cheese. Whole-fat or sweetened yogurt. Full-fat cheeses or blue cheese. Nondairy creamers and whipped toppings. Processed cheese, cheese spreads, or cheese curds. Condiments Onion and garlic salt, seasoned salt, table salt, and sea salt. Canned and packaged gravies. Worcestershire sauce. Tartar sauce. Barbecue sauce. Teriyaki sauce. Soy sauce, including reduced sodium. Steak sauce. Fish sauce. Oyster sauce. Cocktail sauce. Horseradish. Ketchup and mustard. Meat flavorings and tenderizers. Bouillon cubes. Hot sauce. Tabasco sauce. Marinades. Taco seasonings. Relishes. Fats and Oils Butter, stick margarine, lard, shortening, ghee, and bacon fat. Coconut, palm kernel, or palm oils. Regular salad dressings. Other Pickles and olives. Salted popcorn and pretzels. The items listed above may not be a complete list of foods and beverages to avoid. Contact your dietitian for more information. WHERE CAN I FIND MORE INFORMATION? National Heart, Lung, and Blood Institute: CablePromo.itwww.nhlbi.nih.gov/health/health-topics/topics/dash/   This information is not intended to replace advice given to you by your health care provider. Make sure you discuss any questions you have with  your health care provider.   Document Released: 03/31/2011 Document Revised: 05/02/2014 Document Reviewed: 02/13/2013 Elsevier Interactive Patient Education [employer]2016 Elsevier Inc.

## 2016-01-22 NOTE — Assessment & Plan Note (Signed)
Continue iron-rich foods; check CBC and ferritin today

## 2016-01-22 NOTE — Assessment & Plan Note (Signed)
Continue meds but increase hydralazine; if not controlled then refer to nephrologist; weight loss and DASH guidelines; recheck 3 weeks

## 2016-01-25 ENCOUNTER — Other Ambulatory Visit: Payer: Self-pay | Admitting: Family Medicine

## 2016-01-25 DIAGNOSIS — D509 Iron deficiency anemia, unspecified: Secondary | ICD-10-CM

## 2016-01-25 NOTE — Assessment & Plan Note (Addendum)
Continue iron x 2 more months, check stool cards; recheck CBC in 2 months

## 2016-01-25 NOTE — Progress Notes (Signed)
Continue iron; recheck in 2 months; will also have her do stool cards x 3

## 2016-01-30 LAB — ALDOSTERONE + RENIN ACTIVITY W/ RATIO
ALDO / PRA RATIO: 187.5 ratio — AB (ref 0.9–28.9)
Aldosterone: 15 ng/dL
PRA LC/MS/MS: 0.08 ng/mL/h — AB (ref 0.25–5.82)

## 2016-02-03 ENCOUNTER — Ambulatory Visit: Payer: Medicaid Other | Admitting: Obstetrics & Gynecology

## 2016-02-18 ENCOUNTER — Ambulatory Visit (INDEPENDENT_AMBULATORY_CARE_PROVIDER_SITE_OTHER): Payer: Medicaid Other | Admitting: Family Medicine

## 2016-02-18 ENCOUNTER — Encounter: Payer: Self-pay | Admitting: Family Medicine

## 2016-02-18 DIAGNOSIS — M5417 Radiculopathy, lumbosacral region: Secondary | ICD-10-CM

## 2016-02-18 DIAGNOSIS — M5416 Radiculopathy, lumbar region: Secondary | ICD-10-CM

## 2016-02-18 DIAGNOSIS — I10 Essential (primary) hypertension: Secondary | ICD-10-CM | POA: Diagnosis not present

## 2016-02-18 MED ORDER — METHOCARBAMOL 500 MG PO TABS: 500.0000 mg | ORAL_TABLET | Freq: Four times a day (QID) | ORAL | 0 refills | Status: AC | PRN

## 2016-02-18 NOTE — Assessment & Plan Note (Signed)
Pain contributes to decreased activity, also stress eating plays a role; discussed hypnosis; in my opinion, it would dangerous to use phentermine given her HTN; [facility]

## 2016-02-18 NOTE — Patient Instructions (Signed)
Check out the information at familydoctor.org entitled "Nutrition for Weight Loss: What You Need to Know about Fad Diets" Try to lose between 1-2 pounds per week by taking in fewer calories and burning off more calories You can succeed by limiting portions, limiting foods dense in calories and fat, becoming more active, and drinking 8 glasses of water a day (64 ounces) Don't skip meals, especially breakfast, as skipping meals may alter your metabolism Do not use over-the-counter weight loss pills or gimmicks that claim rapid weight loss A healthy BMI (or body mass index) is between 18.5 and 24.9 You can calculate your ideal BMI at the NIH website JobEconomics.huhttp://www.nhlbi.nih.gov/health/educational/lose_wt/BMI/bmicalc.htm  Use the new medicine for back pain We'll have you see the specialists Increase your blood pressure medicine Your goal blood pressure is less than 140 mmHg on top. Try to follow the DASH guidelines (DASH stands for Dietary Approaches to Stop Hypertension) Try to limit the sodium in your diet.  Ideally, consume less than 1.5 grams (less than 1,500mg ) per day. Do not add salt when cooking or at the table.  Check the sodium amount on labels when shopping, and choose items lower in sodium when given a choice. Avoid or limit foods that already contain a lot of sodium. Eat a diet rich in fruits and vegetables and whole grains.
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
## 2016-02-22 ENCOUNTER — Ambulatory Visit: Payer: Medicaid Other | Admitting: Obstetrics & Gynecology

## 2016-02-24 ENCOUNTER — Telehealth: Payer: Self-pay | Admitting: Family Medicine

## 2016-02-24 ENCOUNTER — Ambulatory Visit: Payer: Medicaid Other

## 2016-02-24 NOTE — Telephone Encounter (Signed)
Olegario MessierKathy [facility] is requesting last 3 month of office notes, labs and xrays for pain management referral  (F) [phone] (W) [phone]

## 2016-02-25 NOTE — Telephone Encounter (Signed)
done

## 2016-03-01 ENCOUNTER — Encounter: Payer: Self-pay | Admitting: Obstetrics & Gynecology

## 2016-03-01 ENCOUNTER — Ambulatory Visit (INDEPENDENT_AMBULATORY_CARE_PROVIDER_SITE_OTHER): Payer: Medicaid Other | Admitting: Obstetrics & Gynecology

## 2016-03-01 ENCOUNTER — Other Ambulatory Visit (HOSPITAL_COMMUNITY)
Admission: RE | Admit: 2016-03-01 | Discharge: 2016-03-01 | Disposition: A | Discharge status: Home / Self Care | Payer: Medicaid Other | Source: Ambulatory Visit | Attending: Obstetrics & Gynecology | Admitting: Obstetrics & Gynecology

## 2016-03-01 ENCOUNTER — Encounter: Payer: Self-pay | Admitting: *Deleted

## 2016-03-01 VITALS — BP 153/104 | HR 73 | Ht 60.0 in | Wt 264.0 lb

## 2016-03-01 DIAGNOSIS — Z1151 Encounter for screening for human papillomavirus (HPV): Secondary | ICD-10-CM | POA: Insufficient documentation

## 2016-03-01 DIAGNOSIS — N938 Other specified abnormal uterine and vaginal bleeding: Secondary | ICD-10-CM | POA: Diagnosis not present

## 2016-03-01 DIAGNOSIS — Z3042 Encounter for surveillance of injectable contraceptive: Secondary | ICD-10-CM

## 2016-03-01 DIAGNOSIS — Z124 Encounter for screening for malignant neoplasm of cervix: Secondary | ICD-10-CM

## 2016-03-01 DIAGNOSIS — Z310 Encounter for reversal of previous sterilization: Secondary | ICD-10-CM

## 2016-03-01 DIAGNOSIS — Z01419 Encounter for gynecological examination (general) (routine) without abnormal findings: Secondary | ICD-10-CM

## 2016-03-01 DIAGNOSIS — Z1231 Encounter for screening mammogram for malignant neoplasm of breast: Secondary | ICD-10-CM

## 2016-03-01 DIAGNOSIS — Z113 Encounter for screening for infections with a predominantly sexual mode of transmission: Secondary | ICD-10-CM | POA: Diagnosis not present

## 2016-03-01 NOTE — Patient Instructions (Signed)
Preventive Care for Adults, Female A healthy lifestyle and preventive care can promote health and wellness. Preventive health guidelines for women include the following key practices.  A routine yearly physical is a good way to check with your health care provider about your health and preventive screening. It is a chance to share any concerns and updates on your health and to receive a thorough exam.  Visit your dentist for a routine exam and preventive care every 6 months. Brush your teeth twice a day and floss once a day. Good oral hygiene prevents tooth decay and gum disease.  The frequency of eye exams is based on your age, health, family medical history, use of contact lenses, and other factors. Follow your health care provider's recommendations for frequency of eye exams.  Eat a healthy diet. Foods like vegetables, fruits, whole grains, low-fat dairy products, and lean protein foods contain the nutrients you need without too many calories. Decrease your intake of foods high in solid fats, added sugars, and salt. Eat the right amount of calories for you.Get information about a proper diet from your health care provider, if necessary.  Regular physical exercise is one of the most important things you can do for your health. Most adults should get at least 150 minutes of moderate-intensity exercise (any activity that increases your heart rate and causes you to sweat) each week. In addition, most adults need muscle-strengthening exercises on 2 or more days a week.  Maintain a healthy weight. The body mass index (BMI) is a screening tool to identify possible weight problems. It provides an estimate of body fat based on height and weight. Your health care provider can find your BMI and can help you achieve or maintain a healthy weight.For adults 20 years and older:  A BMI below 18.5 is considered underweight.  A BMI of 18.5 to 24.9 is normal.  A BMI of 25 to 29.9 is considered overweight.  A  BMI of 30 and above is considered obese.  Maintain normal blood lipids and cholesterol levels by exercising and minimizing your intake of saturated fat. Eat a balanced diet with plenty of fruit and vegetables. Blood tests for lipids and cholesterol should begin at age 45 and be repeated every 5 years. If your lipid or cholesterol levels are high, you are over 50, or you are at high risk for heart disease, you may need your cholesterol levels checked more frequently.Ongoing high lipid and cholesterol levels should be treated with medicines if diet and exercise are not working.  If you smoke, find out from your health care provider how to quit. If you do not use tobacco, do not start.  Lung cancer screening is recommended for adults aged 45-80 years who are at high risk for developing lung cancer because of a history of smoking. A yearly low-dose CT scan of the lungs is recommended for people who have at least a 30-pack-year history of smoking and are a current smoker or have quit within the past 15 years. A pack year of smoking is smoking an average of 1 pack of cigarettes a day for 1 year (for example: 1 pack a day for 30 years or 2 packs a day for 15 years). Yearly screening should continue until the smoker has stopped smoking for at least 15 years. Yearly screening should be stopped for people who develop a health problem that would prevent them from having lung cancer treatment.  If you are pregnant, do not drink alcohol. If you are  breastfeeding, be very cautious about drinking alcohol. If you are not pregnant and choose to drink alcohol, do not have more than 1 drink per day. One drink is considered to be 12 ounces (355 mL) of beer, 5 ounces (148 mL) of wine, or 1.5 ounces (44 mL) of liquor.  Avoid use of street drugs. Do not share needles with anyone. Ask for help if you need support or instructions about stopping the use of drugs.  High blood pressure causes heart disease and increases the risk  of stroke. Your blood pressure should be checked at least every 1 to 2 years. Ongoing high blood pressure should be treated with medicines if weight loss and exercise do not work.  If you are 55-79 years old, ask your health care provider if you should take aspirin to prevent strokes.  Diabetes screening is done by taking a blood sample to check your blood glucose level after you have not eaten for a certain period of time (fasting). If you are not overweight and you do not have risk factors for diabetes, you should be screened once every 3 years starting at age 45. If you are overweight or obese and you are 40-70 years of age, you should be screened for diabetes every year as part of your cardiovascular risk assessment.  Breast cancer screening is essential preventive care for women. You should practice "breast self-awareness." This means understanding the normal appearance and feel of your breasts and may include breast self-examination. Any changes detected, no matter how small, should be reported to a health care provider. Women in their 20s and 30s should have a clinical breast exam (CBE) by a health care provider as part of a regular health exam every 1 to 3 years. After age 40, women should have a CBE every year. Starting at age 40, women should consider having a mammogram (breast X-ray test) every year. Women who have a family history of breast cancer should talk to their health care provider about genetic screening. Women at a high risk of breast cancer should talk to their health care providers about having an MRI and a mammogram every year.  Breast cancer gene (BRCA)-related cancer risk assessment is recommended for women who have family members with BRCA-related cancers. BRCA-related cancers include breast, ovarian, tubal, and peritoneal cancers. Having family members with these cancers may be associated with an increased risk for harmful changes (mutations) in the breast cancer genes BRCA1 and  BRCA2. Results of the assessment will determine the need for genetic counseling and BRCA1 and BRCA2 testing.  Your health care provider may recommend that you be screened regularly for cancer of the pelvic organs (ovaries, uterus, and vagina). This screening involves a pelvic examination, including checking for microscopic changes to the surface of your cervix (Pap test). You may be encouraged to have this screening done every 3 years, beginning at age 21.  For women ages 30-65, health care providers may recommend pelvic exams and Pap testing every 3 years, or they may recommend the Pap and pelvic exam, combined with testing for human papilloma virus (HPV), every 5 years. Some types of HPV increase your risk of cervical cancer. Testing for HPV may also be done on women of any age with unclear Pap test results.  Other health care providers may not recommend any screening for nonpregnant women who are considered low risk for pelvic cancer and who do not have symptoms. Ask your health care provider if a screening pelvic exam is right for   you.  If you have had past treatment for cervical cancer or a condition that could lead to cancer, you need Pap tests and screening for cancer for at least 20 years after your treatment. If Pap tests have been discontinued, your risk factors (such as having a new sexual partner) need to be reassessed to determine if screening should resume. Some women have medical problems that increase the chance of getting cervical cancer. In these cases, your health care provider may recommend more frequent screening and Pap tests.  Colorectal cancer can be detected and often prevented. Most routine colorectal cancer screening begins at the age of 50 years and continues through age 75 years. However, your health care provider may recommend screening at an earlier age if you have risk factors for colon cancer. On a yearly basis, your health care provider may provide home test kits to check  for hidden blood in the stool. Use of a small camera at the end of a tube, to directly examine the colon (sigmoidoscopy or colonoscopy), can detect the earliest forms of colorectal cancer. Talk to your health care provider about this at age 50, when routine screening begins. Direct exam of the colon should be repeated every 5-10 years through age 75 years, unless early forms of precancerous polyps or small growths are found.  People who are at an increased risk for hepatitis B should be screened for this virus. You are considered at high risk for hepatitis B if:  You were born in a country where hepatitis B occurs often. Talk with your health care provider about which countries are considered high risk.  Your parents were born in a high-risk country and you have not received a shot to protect against hepatitis B (hepatitis B vaccine).  You have HIV or AIDS.  You use needles to inject street drugs.  You live with, or have sex with, someone who has hepatitis B.  You get hemodialysis treatment.  You take certain medicines for conditions like cancer, organ transplantation, and autoimmune conditions.  Hepatitis C blood testing is recommended for all people born from 1945 through 1965 and any individual with known risks for hepatitis C.  Practice safe sex. Use condoms and avoid high-risk sexual practices to reduce the spread of sexually transmitted infections (STIs). STIs include gonorrhea, chlamydia, syphilis, trichomonas, herpes, HPV, and human immunodeficiency virus (HIV). Herpes, HIV, and HPV are viral illnesses that have no cure. They can result in disability, cancer, and death.  You should be screened for sexually transmitted illnesses (STIs) including gonorrhea and chlamydia if:  You are sexually active and are younger than 24 years.  You are older than 24 years and your health care provider tells you that you are at risk for this type of infection.  Your sexual activity has changed  since you were last screened and you are at an increased risk for chlamydia or gonorrhea. Ask your health care provider if you are at risk.  If you are at risk of being infected with HIV, it is recommended that you take a prescription medicine daily to prevent HIV infection. This is called preexposure prophylaxis (PrEP). You are considered at risk if:  You are sexually active and do not regularly use condoms or know the HIV status of your partner(s).  You take drugs by injection.  You are sexually active with a partner who has HIV.  Talk with your health care provider about whether you are at high risk of being infected with HIV. If   you choose to begin PrEP, you should first be tested for HIV. You should then be tested every 3 months for as long as you are taking PrEP.  Osteoporosis is a disease in which the bones lose minerals and strength with aging. This can result in serious bone fractures or breaks. The risk of osteoporosis can be identified using a bone density scan. Women ages 67 years and over and women at risk for fractures or osteoporosis should discuss screening with their health care providers. Ask your health care provider whether you should take a calcium supplement or vitamin D to reduce the rate of osteoporosis.  Menopause can be associated with physical symptoms and risks. Hormone replacement therapy is available to decrease symptoms and risks. You should talk to your health care provider about whether hormone replacement therapy is right for you.  Use sunscreen. Apply sunscreen liberally and repeatedly throughout the day. You should seek shade when your shadow is shorter than you. Protect yourself by wearing long sleeves, pants, a wide-brimmed hat, and sunglasses year round, whenever you are outdoors.  Once a month, do a whole body skin exam, using a mirror to look at the skin on your back. Tell your health care provider of new moles, moles that have irregular borders, moles that  are larger than a pencil eraser, or moles that have changed in shape or color.  Stay current with required vaccines (immunizations).  Influenza vaccine. All adults should be immunized every year.  Tetanus, diphtheria, and acellular pertussis (Td, Tdap) vaccine. Pregnant women should receive 1 dose of Tdap vaccine during each pregnancy. The dose should be obtained regardless of the length of time since the last dose. Immunization is preferred during the 27th-36th week of gestation. An adult who has not previously received Tdap or who does not know her vaccine status should receive 1 dose of Tdap. This initial dose should be followed by tetanus and diphtheria toxoids (Td) booster doses every 10 years. Adults with an unknown or incomplete history of completing a 3-dose immunization series with Td-containing vaccines should begin or complete a primary immunization series including a Tdap dose. Adults should receive a Td booster every 10 years.  Varicella vaccine. An adult without evidence of immunity to varicella should receive 2 doses or a second dose if she has previously received 1 dose. Pregnant females who do not have evidence of immunity should receive the first dose after pregnancy. This first dose should be obtained before leaving the health care facility. The second dose should be obtained 4-8 weeks after the first dose.  Human papillomavirus (HPV) vaccine. Females aged 13-26 years who have not received the vaccine previously should obtain the 3-dose series. The vaccine is not recommended for use in pregnant females. However, pregnancy testing is not needed before receiving a dose. If a female is found to be pregnant after receiving a dose, no treatment is needed. In that case, the remaining doses should be delayed until after the pregnancy. Immunization is recommended for any person with an immunocompromised condition through the age of 61 years if she did not get any or all doses earlier. During the  3-dose series, the second dose should be obtained 4-8 weeks after the first dose. The third dose should be obtained 24 weeks after the first dose and 16 weeks after the second dose.  Zoster vaccine. One dose is recommended for adults aged 30 years or older unless certain conditions are present.  Measles, mumps, and rubella (MMR) vaccine. Adults born  before 1957 generally are considered immune to measles and mumps. Adults born in 1957 or later should have 1 or more doses of MMR vaccine unless there is a contraindication to the vaccine or there is laboratory evidence of immunity to each of the three diseases. A routine second dose of MMR vaccine should be obtained at least 28 days after the first dose for students attending postsecondary schools, health care workers, or international travelers. People who received inactivated measles vaccine or an unknown type of measles vaccine during 1963-1967 should receive 2 doses of MMR vaccine. People who received inactivated mumps vaccine or an unknown type of mumps vaccine before 1979 and are at high risk for mumps infection should consider immunization with 2 doses of MMR vaccine. For females of childbearing age, rubella immunity should be determined. If there is no evidence of immunity, females who are not pregnant should be vaccinated. If there is no evidence of immunity, females who are pregnant should delay immunization until after pregnancy. Unvaccinated health care workers born before 1957 who lack laboratory evidence of measles, mumps, or rubella immunity or laboratory confirmation of disease should consider measles and mumps immunization with 2 doses of MMR vaccine or rubella immunization with 1 dose of MMR vaccine.  Pneumococcal 13-valent conjugate (PCV13) vaccine. When indicated, a person who is uncertain of his immunization history and has no record of immunization should receive the PCV13 vaccine. All adults 65 years of age and older should receive this  vaccine. An adult aged 19 years or older who has certain medical conditions and has not been previously immunized should receive 1 dose of PCV13 vaccine. This PCV13 should be followed with a dose of pneumococcal polysaccharide (PPSV23) vaccine. Adults who are at high risk for pneumococcal disease should obtain the PPSV23 vaccine at least 8 weeks after the dose of PCV13 vaccine. Adults older than 41 years of age who have normal immune system function should obtain the PPSV23 vaccine dose at least 1 year after the dose of PCV13 vaccine.  Pneumococcal polysaccharide (PPSV23) vaccine. When PCV13 is also indicated, PCV13 should be obtained first. All adults aged 65 years and older should be immunized. An adult younger than age 65 years who has certain medical conditions should be immunized. Any person who resides in a nursing home or long-term care facility should be immunized. An adult smoker should be immunized. People with an immunocompromised condition and certain other conditions should receive both PCV13 and PPSV23 vaccines. People with human immunodeficiency virus (HIV) infection should be immunized as soon as possible after diagnosis. Immunization during chemotherapy or radiation therapy should be avoided. Routine use of PPSV23 vaccine is not recommended for American Indians, [place] Natives, or people younger than 65 years unless there are medical conditions that require PPSV23 vaccine. When indicated, people who have unknown immunization and have no record of immunization should receive PPSV23 vaccine. One-time revaccination 5 years after the first dose of PPSV23 is recommended for people aged 19-64 years who have chronic kidney failure, nephrotic syndrome, asplenia, or immunocompromised conditions. People who received 1-2 doses of PPSV23 before age 65 years should receive another dose of PPSV23 vaccine at age 65 years or later if at least 5 years have passed since the previous dose. Doses of PPSV23 are not  needed for people immunized with PPSV23 at or after age 65 years.  Meningococcal vaccine. Adults with asplenia or persistent complement component deficiencies should receive 2 doses of quadrivalent meningococcal conjugate (MenACWY-D) vaccine. The doses should be obtained   at least 2 months apart. Microbiologists working with certain meningococcal bacteria, [place] recruits, people at risk during an outbreak, and people who travel to or live in countries with a high rate of meningitis should be immunized. A first-year college student up through age 34 years who is living in a residence hall should receive a dose if she did not receive a dose on or after her 16th birthday. Adults who have certain high-risk conditions should receive one or more doses of vaccine.  Hepatitis A vaccine. Adults who wish to be protected from this disease, have certain high-risk conditions, work with hepatitis A-infected animals, work in hepatitis A research labs, or travel to or work in countries with a high rate of hepatitis A should be immunized. Adults who were previously unvaccinated and who anticipate close contact with an international adoptee during the first 60 days after arrival in the [place] from a country with a high rate of hepatitis A should be immunized.  Hepatitis B vaccine. Adults who wish to be protected from this disease, have certain high-risk conditions, may be exposed to blood or other infectious body fluids, are household contacts or sex partners of hepatitis B positive people, are clients or workers in certain care facilities, or travel to or work in countries with a high rate of hepatitis B should be immunized.  Haemophilus influenzae type b (Hib) vaccine. A previously unvaccinated person with asplenia or sickle cell disease or having a scheduled splenectomy should receive 1 dose of Hib vaccine. Regardless of previous immunization, a recipient of a hematopoietic stem cell transplant should receive a  3-dose series 6-12 months after her successful transplant. Hib vaccine is not recommended for adults with HIV infection. Preventive Services / Frequency Ages 35 to 4 years  Blood pressure check.** / Every 3-5 years.  Lipid and cholesterol check.** / Every 5 years beginning at age 60.  Clinical breast exam.** / Every 3 years for women in their 71s and 10s.  BRCA-related cancer risk assessment.** / For women who have family members with a BRCA-related cancer (breast, ovarian, tubal, or peritoneal cancers).  Pap test.** / Every 2 years from ages 76 through 26. Every 3 years starting at age 61 through age 76 or 93 with a history of 3 consecutive normal Pap tests.  HPV screening.** / Every 3 years from ages 37 through ages 60 to 51 with a history of 3 consecutive normal Pap tests.  Hepatitis C blood test.** / For any individual with known risks for hepatitis C.  Skin self-exam. / Monthly.  Influenza vaccine. / Every year.  Tetanus, diphtheria, and acellular pertussis (Tdap, Td) vaccine.** / Consult your health care provider. Pregnant women should receive 1 dose of Tdap vaccine during each pregnancy. 1 dose of Td every 10 years.  Varicella vaccine.** / Consult your health care provider. Pregnant females who do not have evidence of immunity should receive the first dose after pregnancy.  HPV vaccine. / 3 doses over 6 months, if 93 and younger. The vaccine is not recommended for use in pregnant females. However, pregnancy testing is not needed before receiving a dose.  Measles, mumps, rubella (MMR) vaccine.** / You need at least 1 dose of MMR if you were born in 1957 or later. You may also need a 2nd dose. For females of childbearing age, rubella immunity should be determined. If there is no evidence of immunity, females who are not pregnant should be vaccinated. If there is no evidence of immunity, females who are  pregnant should delay immunization until after pregnancy.  Pneumococcal  13-valent conjugate (PCV13) vaccine.** / Consult your health care provider.  Pneumococcal polysaccharide (PPSV23) vaccine.** / 1 to 2 doses if you smoke cigarettes or if you have certain conditions.  Meningococcal vaccine.** / 1 dose if you are age 68 to 8 years and a Market researcher living in a residence hall, or have one of several medical conditions, you need to get vaccinated against meningococcal disease. You may also need additional booster doses.  Hepatitis A vaccine.** / Consult your health care provider.  Hepatitis B vaccine.** / Consult your health care provider.  Haemophilus influenzae type b (Hib) vaccine.** / Consult your health care provider. Ages 7 to 53 years  Blood pressure check.** / Every year.  Lipid and cholesterol check.** / Every 5 years beginning at age 25 years.  Lung cancer screening. / Every year if you are aged 11-80 years and have a 30-pack-year history of smoking and currently smoke or have quit within the past 15 years. Yearly screening is stopped once you have quit smoking for at least 15 years or develop a health problem that would prevent you from having lung cancer treatment.  Clinical breast exam.** / Every year after age 48 years.  BRCA-related cancer risk assessment.** / For women who have family members with a BRCA-related cancer (breast, ovarian, tubal, or peritoneal cancers).  Mammogram.** / Every year beginning at age 41 years and continuing for as long as you are in good health. Consult with your health care provider.  Pap test.** / Every 3 years starting at age 65 years through age 37 or 70 years with a history of 3 consecutive normal Pap tests.  HPV screening.** / Every 3 years from ages 72 years through ages 60 to 40 years with a history of 3 consecutive normal Pap tests.  Fecal occult blood test (FOBT) of stool. / Every year beginning at age 21 years and continuing until age 5 years. You may not need to do this test if you get  a colonoscopy every 10 years.  Flexible sigmoidoscopy or colonoscopy.** / Every 5 years for a flexible sigmoidoscopy or every 10 years for a colonoscopy beginning at age 35 years and continuing until age 48 years.  Hepatitis C blood test.** / For all people born from [birth year] through 1965 and any individual with known risks for hepatitis C.  Skin self-exam. / Monthly.  Influenza vaccine. / Every year.  Tetanus, diphtheria, and acellular pertussis (Tdap/Td) vaccine.** / Consult your health care provider. Pregnant women should receive 1 dose of Tdap vaccine during each pregnancy. 1 dose of Td every 10 years.  Varicella vaccine.** / Consult your health care provider. Pregnant females who do not have evidence of immunity should receive the first dose after pregnancy.  Zoster vaccine.** / 1 dose for adults aged 30 years or older.  Measles, mumps, rubella (MMR) vaccine.** / You need at least 1 dose of MMR if you were born in 1957 or later. You may also need a second dose. For females of childbearing age, rubella immunity should be determined. If there is no evidence of immunity, females who are not pregnant should be vaccinated. If there is no evidence of immunity, females who are pregnant should delay immunization until after pregnancy.  Pneumococcal 13-valent conjugate (PCV13) vaccine.** / Consult your health care provider.  Pneumococcal polysaccharide (PPSV23) vaccine.** / 1 to 2 doses if you smoke cigarettes or if you have certain conditions.  Meningococcal vaccine.** /  Consult your health care provider.  Hepatitis A vaccine.** / Consult your health care provider.  Hepatitis B vaccine.** / Consult your health care provider.  Haemophilus influenzae type b (Hib) vaccine.** / Consult your health care provider. Ages 64 years and over  Blood pressure check.** / Every year.  Lipid and cholesterol check.** / Every 5 years beginning at age 23 years.  Lung cancer screening. / Every year if you  are aged 16-80 years and have a 30-pack-year history of smoking and currently smoke or have quit within the past 15 years. Yearly screening is stopped once you have quit smoking for at least 15 years or develop a health problem that would prevent you from having lung cancer treatment.  Clinical breast exam.** / Every year after age 74 years.  BRCA-related cancer risk assessment.** / For women who have family members with a BRCA-related cancer (breast, ovarian, tubal, or peritoneal cancers).  Mammogram.** / Every year beginning at age 44 years and continuing for as long as you are in good health. Consult with your health care provider.  Pap test.** / Every 3 years starting at age 58 years through age 22 or 39 years with 3 consecutive normal Pap tests. Testing can be stopped between 65 and 70 years with 3 consecutive normal Pap tests and no abnormal Pap or HPV tests in the past 10 years.  HPV screening.** / Every 3 years from ages 64 years through ages 70 or 61 years with a history of 3 consecutive normal Pap tests. Testing can be stopped between 65 and 70 years with 3 consecutive normal Pap tests and no abnormal Pap or HPV tests in the past 10 years.  Fecal occult blood test (FOBT) of stool. / Every year beginning at age 40 years and continuing until age 27 years. You may not need to do this test if you get a colonoscopy every 10 years.  Flexible sigmoidoscopy or colonoscopy.** / Every 5 years for a flexible sigmoidoscopy or every 10 years for a colonoscopy beginning at age 7 years and continuing until age 32 years.  Hepatitis C blood test.** / For all people born from [birth year] through 1965 and any individual with known risks for hepatitis C.  Osteoporosis screening.** / A one-time screening for women ages 30 years and over and women at risk for fractures or osteoporosis.  Skin self-exam. / Monthly.  Influenza vaccine. / Every year.  Tetanus, diphtheria, and acellular pertussis (Tdap/Td)  vaccine.** / 1 dose of Td every 10 years.  Varicella vaccine.** / Consult your health care provider.  Zoster vaccine.** / 1 dose for adults aged 35 years or older.  Pneumococcal 13-valent conjugate (PCV13) vaccine.** / Consult your health care provider.  Pneumococcal polysaccharide (PPSV23) vaccine.** / 1 dose for all adults aged 46 years and older.  Meningococcal vaccine.** / Consult your health care provider.  Hepatitis A vaccine.** / Consult your health care provider.  Hepatitis B vaccine.** / Consult your health care provider.  Haemophilus influenzae type b (Hib) vaccine.** / Consult your health care provider. ** Family history and personal history of risk and conditions may change your health care provider's recommendations.   This information is not intended to replace advice given to you by your health care provider. Make sure you discuss any questions you have with your health care provider.   Document Released: 06/07/2001 Document Revised: 05/02/2014 Document Reviewed: 09/06/2010 Elsevier Interactive Patient Education [employer].

## 2016-03-01 NOTE — Progress Notes (Signed)
GYNECOLOGY ANNUAL PREVENTATIVE CARE ENCOUNTER NOTE  Subjective:   Kathleen Rush is a 40 y.o. [id number] female here for a routine annual gynecologic exam.  Current complaints: none.  Desires STI screen, and re-referral to [facility] for tubal sterilization reversal consultation.   Denies abnormal vaginal bleeding, discharge, pelvic pain, problems with intercourse or other gynecologic concerns.    Gynecologic History No LMP recorded. Patient on Depo Provera for AUB. Contraception: tubal ligation. Last Pap: 2014. Results were: normal Never had a mammogram.  Obstetric History OB History  Gravida Para Term Preterm AB Living  4 2 2  0 2 2  SAB TAB Ectopic Multiple Live Births  0 2 0 0 2    # Outcome Date GA Lbr Len/2nd Weight Sex Delivery Anes PTL Lv  4 TAB           3 TAB           2 Term      Vag-Spont   LIV  1 Term      CS-Unspec   LIV      Past Medical History:  Diagnosis Date  . Anemia   . Anxiety   . Eczema   . Hypertension   . Syncopal episodes OCT 2015   PT STATES SHE IS TOLD SHE PASSES OUT BUT THINKS SHE HAS SEIZURES?    Past Surgical History:  Procedure Laterality Date  . BILATERAL CARPAL TUNNEL RELEASE Bilateral 10/14/2014   Procedure: BILATERAL CARPAL TUNNEL RELEASE;  Surgeon: Myra Rudehristopher Smith, MD;  Location: [facility] ORS;  Service: Orthopedics;  Laterality: Bilateral;  . CESAREAN SECTION    . TUBAL LIGATION      Current Outpatient Prescriptions on File Prior to Visit  Medication Sig Dispense Refill  . ELIDEL 1 % cream APP AA OF FACE BID UNTIL CLEAR  1  . ferrous sulfate 325 (65 FE) MG tablet Take 1 tablet (325 mg total) by mouth 2 (two) times daily with a meal. 60 tablet 3  . hydrALAZINE (APRESOLINE) 50 MG tablet Take 1 tablet (50 mg total) by mouth 3 (three) times daily. 270 tablet 1  . lisinopril-hydrochlorothiazide (PRINZIDE,ZESTORETIC) 20-12.5 MG tablet Take 2 tablets by mouth every morning. 180 tablet 1  . megestrol (MEGACE) 40 MG tablet Take 1 tablet (40 mg  total) by mouth 2 (two) times daily. 60 tablet 2  . methocarbamol (ROBAXIN) 500 MG tablet Take 1-2 tablets (500-1,000 mg total) by mouth every 6 (six) hours as needed for muscle spasms. 50 tablet 0  . metoprolol succinate (TOPROL-XL) 50 MG 24 hr tablet Take 1 tablet (50 mg total) by mouth daily. Take with or immediately following a meal. 90 tablet 3  . triamcinolone ointment (KENALOG) 0.1 % Apply 1 application topically every morning. Apply for eczema  0   Current Facility-Administered Medications on File Prior to Visit  Medication Dose Route Frequency Provider Last Rate Last Dose  . medroxyPROGESTERone (DEPO-PROVERA) injection 150 mg  150 mg Intramuscular Q90 days Adam PhenixJames G Arnold, MD   150 mg at 12/09/15 1500  . medroxyPROGESTERone (DEPO-PROVERA) injection 150 mg  150 mg Intramuscular Once Allie BossierMyra C Dove, MD        No Known Allergies  Social History   Social History  . Marital status: Married    Spouse name: N/A  . Number of children: N/A  . Years of education: N/A   Occupational History  . Not on file.   Social History Main Topics  . Smoking status: Former Smoker  Types: Cigarettes    Quit date: 09/24/2015  . Smokeless tobacco: Never Used  . Alcohol use 0.6 oz/week    1 Glasses of wine per week     Comment: occasional beer  . Drug use: No  . Sexual activity: Yes    Birth control/ protection: Surgical   Other Topics Concern  . Not on file   Social History Narrative  . No narrative on file    Family History  Problem Relation Age of Onset  . Hypertension Father   . Throat cancer Father   . Hypertension Mother   . Diabetes Paternal Grandfather   . Prostate cancer Paternal Grandfather   . Diabetes Paternal Grandmother   . Throat cancer Paternal Grandmother   . Diabetes Maternal Grandmother   . Diabetes Maternal Grandfather   . Prostate cancer Maternal Grandfather   . Heart disease Neg Hx   . Stroke Neg Hx   . COPD Neg Hx     The following portions of the patient's  history were reviewed and updated as appropriate: allergies, current medications, past family history, past medical history, past social history, past surgical history and problem list.  Review of Systems Pertinent items noted in HPI and remainder of comprehensive ROS otherwise negative.   Objective:  BP (!) 153/104   Pulse 73   Ht 5' (1.524 m)   Wt 264 lb (119.7 kg)   BMI 51.56 kg/m  CONSTITUTIONAL: Well-developed, well-nourished female in no acute distress.  HENT:  Normocephalic, atraumatic, External right and left ear normal. Oropharynx is clear and moist EYES: Conjunctivae and EOM are normal. Pupils are equal, round, and reactive to light. No scleral icterus.  NECK: Normal range of motion, supple, no masses.  Normal thyroid.  SKIN: Skin is warm and dry. No rash noted. Not diaphoretic. No erythema. No pallor. NEUROLOGIC: Alert and oriented to person, place, and time. Normal reflexes, muscle tone coordination. No cranial nerve deficit noted. PSYCHIATRIC: Normal mood and affect. Normal behavior. Normal judgment and thought content. CARDIOVASCULAR: Normal heart rate noted, regular rhythm RESPIRATORY: Clear to auscultation bilaterally. Effort and breath sounds normal, no problems with respiration noted. BREASTS: Symmetric in size, large.  No masses, skin changes, nipple drainage, or lymphadenopathy. ABDOMEN: Soft, obese, normal bowel sounds, no distention appreciated.  No tenderness, rebound or guarding.  PELVIC: Normal appearing external genitalia; normal appearing vaginal mucosa and cervix.  Normal appearing discharge.  Pap smear obtained.  Unable to palpate uterus or adnexa secondary to habitus.  MUSCULOSKELETAL: Normal range of motion. No tenderness.  No cyanosis, clubbing, or edema.  2+ distal pulses.   Assessment:  Annual gynecologic examination with pap smear   Plan:  1. Encounter for gynecological examination with Papanicolaou smear of cervix - Cytology - PAP  2. Screen for  STD (sexually transmitted disease) - Cytology - PAP - HIV antibody - Hepatitis B surface antigen - RPR - Hepatitis C antibody  3. Visit for screening mammogram - MM DIGITAL SCREENING BILATERAL; Future  4. Encounter for Depo-Provera contraception Depo Provera given today for AUB, repeat in 3 months  5. Tubal sterilization reversal request - Ambulatory referral to Infertility to Dr. April MansonYalcinkaya.   Kathleen CollinsUGONNA  Yanci Bachtell, MD, FACOG Attending Obstetrician & Gynecologist, [facility]

## 2016-03-02 LAB — HIV ANTIBODY (ROUTINE TESTING W REFLEX): HIV 1&2 Ab, 4th Generation: NONREACTIVE

## 2016-03-02 LAB — HEPATITIS B SURFACE ANTIGEN: Hepatitis B Surface Ag: NEGATIVE

## 2016-03-02 LAB — RPR

## 2016-03-02 LAB — HEPATITIS C ANTIBODY: HCV AB: NEGATIVE

## 2016-03-03 LAB — CYTOLOGY - PAP
CHLAMYDIA, DNA PROBE: NEGATIVE
DIAGNOSIS: NEGATIVE
HPV (WINDOPATH): NOT DETECTED
NEISSERIA GONORRHEA: NEGATIVE
TRICH (WINDOWPATH): NEGATIVE

## 2016-03-22 ENCOUNTER — Ambulatory Visit: Payer: Medicaid Other | Admitting: Family Medicine

## 2016-03-29 ENCOUNTER — Ambulatory Visit: Payer: Medicaid Other | Attending: Orthopedic Surgery | Admitting: Physical Therapy

## 2016-03-29 DIAGNOSIS — M545 Low back pain, unspecified: Secondary | ICD-10-CM

## 2016-03-29 DIAGNOSIS — G8929 Other chronic pain: Secondary | ICD-10-CM
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
## 2016-04-01 ENCOUNTER — Ambulatory Visit: Payer: Medicaid Other | Admitting: Family Medicine

## 2016-04-06 ENCOUNTER — Ambulatory Visit: Payer: Medicaid Other

## 2016-04-10 ENCOUNTER — Encounter: Payer: Self-pay | Admitting: Family Medicine

## 2016-05-17 ENCOUNTER — Ambulatory Visit (INDEPENDENT_AMBULATORY_CARE_PROVIDER_SITE_OTHER): Payer: Medicaid Other | Admitting: *Deleted

## 2016-05-17 DIAGNOSIS — Z3042 Encounter for surveillance of injectable contraceptive: Secondary | ICD-10-CM | POA: Diagnosis not present

## 2016-05-17 MED ORDER — MEDROXYPROGESTERONE ACETATE 150 MG/ML IM SUSP: 150.0000 mg | INTRAMUSCULAR | 4 refills | Status: AC

## 2016-05-17 NOTE — Progress Notes (Signed)
Pt here today for Depo Provera 150mg  injection, sent next prescription to the pharmacy for pt to pick up and bring to the office for next injection.

## 2016-07-07 ENCOUNTER — Ambulatory Visit: Payer: Medicaid Other | Admitting: Family Medicine

## 2016-07-08 ENCOUNTER — Ambulatory Visit: Payer: Medicaid Other | Admitting: Family Medicine

## 2016-07-11 ENCOUNTER — Ambulatory Visit: Payer: Medicaid Other | Admitting: Family Medicine

## 2016-07-15 ENCOUNTER — Encounter: Payer: Self-pay | Admitting: Family Medicine

## 2016-07-15 ENCOUNTER — Ambulatory Visit (INDEPENDENT_AMBULATORY_CARE_PROVIDER_SITE_OTHER): Payer: Medicaid Other | Admitting: Family Medicine

## 2016-07-15 ENCOUNTER — Ambulatory Visit: Payer: Medicaid Other | Admitting: Family Medicine

## 2016-07-15 DIAGNOSIS — D500 Iron deficiency anemia secondary to blood loss (chronic): Secondary | ICD-10-CM

## 2016-07-15 DIAGNOSIS — J301 Allergic rhinitis due to pollen: Secondary | ICD-10-CM

## 2016-07-15 DIAGNOSIS — D509 Iron deficiency anemia, unspecified: Secondary | ICD-10-CM | POA: Diagnosis not present

## 2016-07-15 DIAGNOSIS — I10 Essential (primary) hypertension: Secondary | ICD-10-CM | POA: Diagnosis not present

## 2016-07-15 MED ORDER — DULOXETINE HCL 60 MG PO CPEP: 60.0000 mg | ORAL_CAPSULE | Freq: Every day | ORAL | 3 refills | Status: AC

## 2016-07-15 MED ORDER — FLUTICASONE PROPIONATE 50 MCG/ACT NA SUSP: 2.0000 | Freq: Every day | NASAL | 3 refills | Status: AC

## 2016-07-15 MED ORDER — FEXOFENADINE HCL 180 MG PO TABS: 180.0000 mg | ORAL_TABLET | Freq: Every day | ORAL | 3 refills | Status: AC

## 2016-07-15 MED ORDER — AMLODIPINE BESYLATE 5 MG PO TABS: 5.0000 mg | ORAL_TABLET | Freq: Every day | ORAL | 3 refills | Status: AC

## 2016-07-15 NOTE — Assessment & Plan Note (Addendum)
Refer to nephrologist; add back the CCB; see AVS; try to follow DASH guidelines; significant weight loss would be of benefit

## 2016-07-15 NOTE — Patient Instructions (Signed)
Start back on the amlodipine We'll have you see the blood pressure specialist Try to use PLAIN allergy medicine without the decongestant Avoid: phenylephrine, phenylpropanolamine, and pseudoephredine Start the nasal spray Your goal blood pressure is less than 140 mmHg on top. Try to follow the DASH guidelines (DASH stands for Dietary Approaches to Stop Hypertension) Try to limit the sodium in your diet.  Ideally, consume less than 1.5 grams (less than 1,500mg ) per day. Do not add salt when cooking or at the table.  Check the sodium amount on labels when shopping, and choose items lower in sodium when given a choice. Avoid or limit foods that already contain a lot of sodium. Eat a diet rich in fruits and vegetables and whole grains.

## 2016-07-15 NOTE — Progress Notes (Signed)
BP (!) 146/92   Pulse 88   Temp 98.4 F (36.9 C) (Oral)   Resp 16   Wt 275 lb (124.7 kg)   SpO2 97%   BMI 53.71 kg/m    Subjective:    Patient ID: Kathleen Rush, female    DOB: Jul 22, 1975, 41 y.o.   MRN: [id number]  HPI: Kathleen Rush is a 41 y.o. female  Chief Complaint  Patient presents with  . Follow-up    on bp  . Medication Refill    Weight continues to be a problem; we could not get medicine covered; she tried going to bariatric clinic; [address] and will pay out of pocket for weight loss medicine She [facility]; that was supposed to help with back pain  She is on depo provera for the bleeding and cramping; that is helping and she loves it; helps the pains  She has high blood pressure; not controlled today; not taking CCB Avoiding salt; in school full time, working two jobs, busy mother and wife; lots of stress  Allergies are kicking high speed; needs something strong; eyes and nose, congestion  [facility] 07/15/2016 02/18/2016 12/15/2015 09/03/2015  Decreased Interest 0 0 1 2  Down, Depressed, Hopeless 1 0 1 1  PHQ - 2 Score 1 0 2 3  Altered sleeping - - 1 2  Tired, decreased energy - - 1 2  Change in appetite - - 1 3  Feeling bad or failure about yourself  - - 0 1  Trouble concentrating - - 0 0  Moving slowly or fidgety/restless - - 0 0  Suicidal thoughts - - 0 0  PHQ-9 Score - - 5 11  Difficult doing work/chores - - Not difficult at all Not difficult at all    Relevant past medical, surgical, family and social history reviewed Past Medical History:  Diagnosis Date  . Anemia   . Anxiety   . Eczema   . Hypertension   . Syncopal episodes OCT 2015   PT STATES SHE IS TOLD SHE PASSES OUT BUT THINKS SHE HAS SEIZURES?   Past Surgical History:  Procedure Laterality Date  . BILATERAL CARPAL TUNNEL RELEASE Bilateral 10/14/2014   Procedure: BILATERAL CARPAL TUNNEL  RELEASE;  Surgeon: Myra Rudehristopher Smith, MD;  Location: [facility] ORS;  Service: Orthopedics;  Laterality: Bilateral;  . CESAREAN SECTION    . TUBAL LIGATION     Family History  Problem Relation Age of Onset  . Hypertension Father   . Throat cancer Father   . Hypertension Mother   . Diabetes Paternal Grandfather   . Prostate cancer Paternal Grandfather   . Diabetes Paternal Grandmother   . Throat cancer Paternal Grandmother   . Diabetes Maternal Grandmother   . Diabetes Maternal Grandfather   . Prostate cancer Maternal Grandfather   . Heart disease Neg Hx   . Stroke Neg Hx   . COPD Neg Hx    Social History  Substance Use Topics  . Smoking status: Former Smoker    Types: Cigarettes    Quit date: 09/24/2015  . Smokeless tobacco: Never Used  . Alcohol use 0.6 oz/week    1 Glasses of wine per week     Comment: occasional beer   Interim medical history since last visit reviewed. Allergies and medications reviewed  Review of Systems Per HPI unless specifically indicated above     Objective:    BP Marland Kitchen(!)  146/92   Pulse 88   Temp 98.4 F (36.9 C) (Oral)   Resp 16   Wt 275 lb (124.7 kg)   SpO2 97%   BMI 53.71 kg/m   Wt Readings from Last 3 Encounters:  07/15/16 275 lb (124.7 kg)  03/01/16 264 lb (119.7 kg)  02/18/16 264 lb (119.7 kg)    Physical Exam  Constitutional: She appears well-developed and well-nourished. No distress.  Morbidly obese; weight gain 11 pounds over last 4-1/2 months  Eyes: EOM are normal. No scleral icterus.  Neck: No thyromegaly present.  Cardiovascular: Normal rate and regular rhythm.   Pulmonary/Chest: Effort normal and breath sounds normal. She has no wheezes.  Musculoskeletal: She exhibits no edema.  Neurological: She is alert.  Skin: No pallor.  Psychiatric: She has a normal mood and affect. Her behavior is normal. Judgment and thought content normal.    Results for orders placed or performed in visit on 03/01/16  HIV antibody  Result Value Ref  Range   HIV 1&2 Ab, 4th Generation NONREACTIVE NONREACTIVE  Hepatitis B surface antigen  Result Value Ref Range   Hepatitis B Surface Ag NEGATIVE NEGATIVE  RPR  Result Value Ref Range   RPR Ser Ql NON REAC NON REAC  Hepatitis C antibody  Result Value Ref Range   HCV Ab NEGATIVE NEGATIVE  Cytology - PAP  Result Value Ref Range   Adequacy      Satisfactory for evaluation  endocervical/transformation zone component PRESENT.   Diagnosis      NEGATIVE FOR INTRAEPITHELIAL LESIONS OR MALIGNANCY.   Chlamydia Negative    Neisseria gonorrhea Negative    HPV NOT DETECTED    Trichomonas Negative    Material Submitted CervicoVaginal Pap [ThinPrep Imaged]    CYTOLOGY - PAP PAP RESULT       Assessment & Plan:   Problem List Items Addressed This Visit      Cardiovascular and Mediastinum   Hypertension (Chronic)    Refer to nephrologist; add back the CCB; see AVS; try to follow DASH guidelines; significant weight loss would be of benefit      Relevant Medications   amLODipine (NORVASC) 5 MG tablet   Other Relevant Orders   Ambulatory referral to Nephrology     Respiratory   Allergic rhinitis    Avoid decongestants which can adversely affect blood pressure; Rx provided        Other   Morbid obesity (HCC) (Chronic)    Patient frustrated by her weight gain; nothing covered by insurance in terms of weight loss pills; I do NOT recommend phentermine for her with her hypertension and she is aware of that; bariatric surgery would be an option, but again, insurance is the problem; encouragement given, reduce calories, hydrate      Microcytic anemia    Pt declined labs; my orders are in system      Iron deficiency anemia due to chronic blood loss    Pt declined labs today          Follow up plan: Return in about 2 weeks (around 07/29/2016) for BP recheck with CMA; 3 months with Dr. Sherie Don.  An after-visit summary was printed and given to the patient at check-out.  Please see the  patient instructions which may contain other information and recommendations beyond what is mentioned above in the assessment and plan.  Meds ordered this encounter  Medications  . DISCONTD: DULoxetine (CYMBALTA) 60 MG capsule    Sig: Take 60 mg by  mouth daily.  Marland Kitchen amLODipine (NORVASC) 5 MG tablet    Sig: Take 1 tablet (5 mg total) by mouth daily.    Dispense:  90 tablet    Refill:  3  . DULoxetine (CYMBALTA) 60 MG capsule    Sig: Take 1 capsule (60 mg total) by mouth daily.    Dispense:  90 capsule    Refill:  3  . fluticasone (FLONASE) 50 MCG/ACT nasal spray    Sig: Place 2 sprays into both nostrils daily.    Dispense:  48 g    Refill:  3  . fexofenadine (ALLEGRA ALLERGY) 180 MG tablet    Sig: Take 1 tablet (180 mg total) by mouth daily. If needed for allergies    Dispense:  90 tablet    Refill:  3    Orders Placed This Encounter  Procedures  . Ambulatory referral to Nephrology

## 2016-07-18 DIAGNOSIS — J309 Allergic rhinitis, unspecified: Secondary | ICD-10-CM | POA: Insufficient documentation

## 2016-07-18 NOTE — Assessment & Plan Note (Signed)
Patient frustrated by her weight gain; nothing covered by insurance in terms of weight loss pills; I do NOT recommend phentermine for her with her hypertension and she is aware of that; bariatric surgery would be an option, but again, insurance is the problem; encouragement given, reduce calories, hydrate

## 2016-07-18 NOTE — Assessment & Plan Note (Signed)
Pt declined labs today 

## 2016-07-18 NOTE — Assessment & Plan Note (Signed)
Avoid decongestants which can adversely affect blood pressure; Rx provided

## 2016-07-18 NOTE — Assessment & Plan Note (Signed)
Pt declined labs; my orders are in system

## 2016-07-26 ENCOUNTER — Encounter: Payer: Self-pay | Admitting: Obstetrics & Gynecology

## 2016-07-27 ENCOUNTER — Other Ambulatory Visit (INDEPENDENT_AMBULATORY_CARE_PROVIDER_SITE_OTHER): Payer: Medicaid Other | Admitting: *Deleted

## 2016-07-27 ENCOUNTER — Other Ambulatory Visit (HOSPITAL_COMMUNITY)
Admission: RE | Admit: 2016-07-27 | Discharge: 2016-07-27 | Disposition: A | Discharge status: Home / Self Care | Payer: Medicaid Other | Source: Ambulatory Visit | Attending: Obstetrics & Gynecology | Admitting: Obstetrics & Gynecology

## 2016-07-27 DIAGNOSIS — R300 Dysuria: Secondary | ICD-10-CM

## 2016-07-27 DIAGNOSIS — N898 Other specified noninflammatory disorders of vagina: Secondary | ICD-10-CM

## 2016-07-27 LAB — POCT URINALYSIS DIPSTICK
BILIRUBIN UA: NEGATIVE
GLUCOSE UA: NEGATIVE
KETONES UA: NEGATIVE
NITRITE UA: NEGATIVE
Protein, UA: NEGATIVE
Spec Grav, UA: 1.01 (ref 1.030–1.035)
Urobilinogen, UA: 0.2 (ref ?–2.0)
pH, UA: 6.5 (ref 5.0–8.0)

## 2016-07-27 NOTE — Telephone Encounter (Signed)
Called pt about current symptoms. She states she has noticed a vaginal odor and pain with urination. Advised pt to come in for Nurse only visit since previous appt has been taken. Pt will come in today

## 2016-07-27 NOTE — Progress Notes (Signed)
SUBJECTIVE:  41 y.o. female complains of brown color vaginal discharge for 3 day(s) and dysuria. Denies abnormal vaginal bleeding or significant pelvic pain or fever. No UTI symptoms. Denies history of known exposure to STD.  No LMP recorded. Patient has had an injection.  OBJECTIVE:  She appears well, afebrile. Urine dipstick: positive for WBC's and positive for RBC's.  ASSESSMENT:  Vaginal Discharge  Vaginal Odor Dysuria   PLAN:  GC, chlamydia, trichomonas, BVAG, CVAG probe sent to lab. Treatment: To be determined once lab results are received ROV prn if symptoms persist or worsen.

## 2016-07-27 NOTE — Addendum Note (Signed)
Addended by: Arne Cleveland on: 07/27/2016 11:56 AM   Modules accepted: Level of Service

## 2016-07-28 LAB — CERVICOVAGINAL ANCILLARY ONLY
BACTERIAL VAGINITIS: POSITIVE — AB
Candida vaginitis: NEGATIVE
Chlamydia: NEGATIVE
Neisseria Gonorrhea: NEGATIVE
TRICH (WINDOWPATH): POSITIVE — AB

## 2016-07-29 ENCOUNTER — Telehealth: Payer: Self-pay | Admitting: *Deleted

## 2016-07-29 DIAGNOSIS — T3695XA Adverse effect of unspecified systemic antibiotic, initial encounter: Principal | ICD-10-CM

## 2016-07-29 DIAGNOSIS — N760 Acute vaginitis: Principal | ICD-10-CM

## 2016-07-29 DIAGNOSIS — A5901 Trichomonal vulvovaginitis: Secondary | ICD-10-CM

## 2016-07-29 DIAGNOSIS — B379 Candidiasis, unspecified: Secondary | ICD-10-CM

## 2016-07-29 DIAGNOSIS — B9689 Other specified bacterial agents as the cause of diseases classified elsewhere: Secondary | ICD-10-CM

## 2016-07-29 MED ORDER — METRONIDAZOLE 500 MG PO TABS: 500.0000 mg | ORAL_TABLET | Freq: Two times a day (BID) | ORAL | 0 refills | Status: AC

## 2016-07-29 MED ORDER — FLUCONAZOLE 150 MG PO TABS: 150.0000 mg | ORAL_TABLET | Freq: Once | ORAL | 0 refills | Status: AC

## 2016-07-29 NOTE — Telephone Encounter (Signed)
Flagyl sent to pharmacy for + BV and trich on vaginal swab.  Message sent via mychart to pt.

## 2016-07-29 NOTE — Telephone Encounter (Signed)
Pt was being treated with Flagyl for BV and trich, states she gets yeast infections when using antibiotics, requesting Diflucan to be sent to pharmacy.  Rx sent.

## 2016-07-30 LAB — CULTURE, URINE COMPREHENSIVE

## 2016-08-02 ENCOUNTER — Ambulatory Visit
Admission: RE | Admit: 2016-08-02 | Discharge: 2016-08-02 | Disposition: A | Discharge status: Home / Self Care | Payer: Medicaid Other | Source: Ambulatory Visit | Attending: Family Medicine | Admitting: Family Medicine

## 2016-08-02 ENCOUNTER — Encounter: Payer: Self-pay | Admitting: Family Medicine

## 2016-08-02 ENCOUNTER — Ambulatory Visit (INDEPENDENT_AMBULATORY_CARE_PROVIDER_SITE_OTHER): Payer: Medicaid Other | Admitting: Family Medicine

## 2016-08-02 VITALS — BP 136/74 | HR 88 | Temp 98.2°F | Resp 16 | Wt 272.1 lb

## 2016-08-02 DIAGNOSIS — S99922A Unspecified injury of left foot, initial encounter: Secondary | ICD-10-CM | POA: Insufficient documentation

## 2016-08-02 DIAGNOSIS — I10 Essential (primary) hypertension: Secondary | ICD-10-CM

## 2016-08-02 DIAGNOSIS — X58XXXA Exposure to other specified factors, initial encounter: Secondary | ICD-10-CM | POA: Insufficient documentation

## 2016-08-02 MED ORDER — HYDROCODONE-ACETAMINOPHEN 5-325 MG PO TABS: 1.0000 | ORAL_TABLET | Freq: Four times a day (QID) | ORAL | 0 refills | Status: AC | PRN

## 2016-08-02 NOTE — Progress Notes (Signed)
BP 136/74   Pulse 88   Temp 98.2 F (36.8 C) (Oral)   Resp 16   Wt 272 lb 1.6 oz (123.4 kg)   SpO2 99%   BMI 53.14 kg/m    Subjective:    Patient ID: Kathleen Rush, female    DOB: December 21, 1975, 41 y.o.   MRN: [id number]  HPI: Kathleen Rush is a 41 y.o. female  Chief Complaint  Patient presents with  . Toe Pain    hit toe on changing table at work.  Patients states heard a crack. Possibly broken. left foot ring toe  . Medication Refill    metoprolol   Patient hurt her toe yesterday; hit 4th toe left foot on wooden changing table at work yesterday; painful Felt / heard a "krch" sound, painful to walk on In a lot of pain  Hypertension; doing much better on her current regimen; has lost 3 pounds since last visit; gave up beef entirely She had an abnormal aldo:PRA; referral was put in to nephrologist, but patient asks if she still needs to go  Depression screen [facility] 2/9 08/02/2016 07/15/2016 02/18/2016 12/15/2015 09/03/2015  Decreased Interest 0 0 0 1 2  Down, Depressed, Hopeless 1 1 0 1 1  PHQ - 2 Score 1 1 0 2 3  Altered sleeping - - - 1 2  Tired, decreased energy - - - 1 2  Change in appetite - - - 1 3  Feeling bad or failure about yourself  - - - 0 1  Trouble concentrating - - - 0 0  Moving slowly or fidgety/restless - - - 0 0  Suicidal thoughts - - - 0 0  PHQ-9 Score - - - 5 11  Difficult doing work/chores - - - Not difficult at all Not difficult at all    Relevant past medical, surgical, family and social history reviewed Past Medical History:  Diagnosis Date  . Anemia   . Anxiety   . Eczema   . Hypertension   . Syncopal episodes OCT 2015   PT STATES SHE IS TOLD SHE PASSES OUT BUT THINKS SHE HAS SEIZURES?   Past Surgical History:  Procedure Laterality Date  . BILATERAL CARPAL TUNNEL RELEASE Bilateral 10/14/2014   Procedure: BILATERAL CARPAL TUNNEL RELEASE;  Surgeon: Myra Rude, MD;  Location: [facility] ORS;  Service: Orthopedics;  Laterality: Bilateral;    . CESAREAN SECTION    . TUBAL LIGATION     Social History  Substance Use Topics  . Smoking status: Former Smoker    Types: Cigarettes    Quit date: 09/24/2015  . Smokeless tobacco: Never Used  . Alcohol use 0.6 oz/week    1 Glasses of wine per week     Comment: occasional beer   Interim medical history since last visit reviewed. Allergies and medications reviewed  Review of Systems Per HPI unless specifically indicated above     Objective:    BP 136/74   Pulse 88   Temp 98.2 F (36.8 C) (Oral)   Resp 16   Wt 272 lb 1.6 oz (123.4 kg)   SpO2 99%   BMI 53.14 kg/m   Wt Readings from Last 3 Encounters:  08/02/16 272 lb 1.6 oz (123.4 kg)  07/15/16 275 lb (124.7 kg)  03/01/16 264 lb (119.7 kg)    Physical Exam  Constitutional: She appears well-developed and well-nourished.  Morbidly obese but weight down 3 pounds  Cardiovascular: Normal rate and regular rhythm.   Pulses:  Dorsalis pedis pulses are 2+ on the left side.  Pulmonary/Chest: Effort normal and breath sounds normal.  Musculoskeletal:       Left foot: There is decreased range of motion, tenderness, bony tenderness and swelling.  Violaceous color to the 4th digit on the LEFT foot; very tender  Neurological:  Sensation intact 4th toe LEFT foot      Assessment & Plan:   Problem List Items Addressed This Visit      Cardiovascular and Mediastinum   Hypertension (Chronic)    Continue current regimen; try DASH guidelines; keep trying to lose weight        Other   Morbid obesity (HCC) (Chronic)    Encouragement given to keep working on weight loss       Other Visit Diagnoses    Toe injury, left, initial encounter    -  Primary   suspect fracture; send for xray; buddy taping explained, not too tight; ice with sock on to avoid ice to skin contact on toes   Relevant Orders   DG Toe 4th Left      Follow up plan: No Follow-up on file.  An after-visit summary was printed and given to the patient at  check-out.  Please see the patient instructions which may contain other information and recommendations beyond what is mentioned above in the assessment and plan.  Meds ordered this encounter  Medications  . megestrol (MEGACE) 40 MG tablet    Sig: Take 40 mg by mouth 2 (two) times daily.  Marland Kitchen HYDROcodone-acetaminophen (NORCO/VICODIN) 5-325 MG tablet    Sig: Take 1 tablet by mouth every 6 (six) hours as needed for moderate pain.    Dispense:  12 tablet    Refill:  0    Orders Placed This Encounter  Procedures  . DG Toe 4th Left    NCCS[facility] web site reviewed, no other prescribers

## 2016-08-02 NOTE — Patient Instructions (Addendum)
Get the xray today Use the pain medicine if needed Continue your efforts at weight loss! Check out the information at familydoctor.org entitled "Nutrition for Weight Loss: What You Need to Know about Fad Diets" Try to lose between 1-2 pounds per week by taking in fewer calories and burning off more calories You can succeed by limiting portions, limiting foods dense in calories and fat, becoming more active, and drinking 8 glasses of water a day (64 ounces) Don't skip meals, especially breakfast, as skipping meals may alter your metabolism Do not use over-the-counter weight loss pills or gimmicks that claim rapid weight loss A healthy BMI (or body mass index) is between 18.5 and 24.9 You can calculate your ideal BMI at the NIH website [employer] Your goal blood pressure is less than 140 mmHg on top and less than 90 on the bottom Try to follow the DASH guidelines (DASH stands for Dietary Approaches to Stop Hypertension) Try to limit the sodium in your diet.  Ideally, consume less than 1.5 grams (less than 1,500mg ) per day. Do not add salt when cooking or at the table.  Check the sodium amount on labels when shopping, and choose items lower in sodium when given a choice. Avoid or limit foods that already contain a lot of sodium. Eat a diet rich in fruits and vegetables and whole grains.

## 2016-08-02 NOTE — Assessment & Plan Note (Signed)
Encouragement given to keep working on weight loss

## 2016-08-02 NOTE — Assessment & Plan Note (Signed)
Continue current regimen; try DASH guidelines; keep trying to lose weight

## 2016-08-10 ENCOUNTER — Telehealth: Payer: Self-pay | Admitting: Family Medicine

## 2016-08-10 ENCOUNTER — Other Ambulatory Visit: Payer: Self-pay | Admitting: Family Medicine

## 2016-08-10 DIAGNOSIS — R7989 Other specified abnormal findings of blood chemistry: Secondary | ICD-10-CM

## 2016-08-10 DIAGNOSIS — R799 Abnormal finding of blood chemistry, unspecified: Principal | ICD-10-CM

## 2016-08-10 NOTE — Telephone Encounter (Signed)
Message left for nephrologist, asked for a call back to make sure appropriate referral

## 2016-08-11 NOTE — Telephone Encounter (Signed)
Nephrologist kindly called back yesterday; he agrees with seeing patient I called patient and explained referral Apologized that this lab from the Fall was not addressed at this time; if I signed off without addressing, I offer my apologies; if glitch in system and I did not receive until it was discovered last appt, staff is looking into this to see what happened; she thanked me for my openness

## 2016-08-15 ENCOUNTER — Ambulatory Visit: Payer: Medicaid Other

## 2016-08-17 ENCOUNTER — Ambulatory Visit (INDEPENDENT_AMBULATORY_CARE_PROVIDER_SITE_OTHER): Payer: Medicaid Other | Admitting: *Deleted

## 2016-08-17 DIAGNOSIS — N920 Excessive and frequent menstruation with regular cycle: Secondary | ICD-10-CM

## 2016-08-17 NOTE — Progress Notes (Signed)
Pt here today for Depo Provera 150 mg injection.  Last injection given 05-17-16.  Pt is currently using Depo for menorrhagia, has had a BTL for contraception. Depo Provera given.

## 2016-10-14 ENCOUNTER — Ambulatory Visit: Payer: Medicaid Other | Admitting: Family Medicine

## 2016-11-02 ENCOUNTER — Ambulatory Visit: Payer: Medicaid Other

## 2016-11-09 ENCOUNTER — Ambulatory Visit (INDEPENDENT_AMBULATORY_CARE_PROVIDER_SITE_OTHER): Payer: Medicaid Other | Admitting: *Deleted

## 2016-11-09 DIAGNOSIS — Z3042 Encounter for surveillance of injectable contraceptive: Secondary | ICD-10-CM | POA: Diagnosis not present

## 2016-11-09 MED ORDER — MEDROXYPROGESTERONE ACETATE 150 MG/ML IM SUSP
150.0000 mg | Freq: Once | INTRAMUSCULAR | Status: AC
  Administered 2016-11-09: 150 mg via INTRAMUSCULAR

## 2016-11-09 NOTE — Progress Notes (Signed)
Pt here for depo shot. Inj given in right deltoid. Pt tolerated well. Pt is to return 10/5 through 10/17.

## 2016-11-29 ENCOUNTER — Ambulatory Visit: Payer: Medicaid Other | Admitting: Obstetrics & Gynecology

## 2016-12-30 ENCOUNTER — Emergency Department
Admission: EM | Admit: 2016-12-30 | Discharge: 2016-12-30 | Disposition: A | Discharge status: Home / Self Care | Payer: Self-pay | Attending: Emergency Medicine | Admitting: Emergency Medicine

## 2016-12-30 ENCOUNTER — Encounter: Payer: Self-pay | Admitting: Emergency Medicine

## 2016-12-30 DIAGNOSIS — M65312 Trigger thumb, left thumb: Secondary | ICD-10-CM | POA: Insufficient documentation

## 2016-12-30 DIAGNOSIS — Z87891 Personal history of nicotine dependence: Secondary | ICD-10-CM | POA: Insufficient documentation

## 2016-12-30 DIAGNOSIS — Z79899 Other long term (current) drug therapy: Secondary | ICD-10-CM | POA: Insufficient documentation

## 2016-12-30 DIAGNOSIS — M65311 Trigger thumb, right thumb: Secondary | ICD-10-CM | POA: Insufficient documentation

## 2016-12-30 DIAGNOSIS — I10 Essential (primary) hypertension: Secondary | ICD-10-CM | POA: Insufficient documentation

## 2016-12-30 DIAGNOSIS — M79645 Pain in left finger(s): Secondary | ICD-10-CM | POA: Insufficient documentation

## 2016-12-30 MED ORDER — NAPROXEN 500 MG PO TABS: 500.0000 mg | ORAL_TABLET | Freq: Two times a day (BID) | ORAL | 0 refills | Status: AC

## 2016-12-30 NOTE — ED Provider Notes (Signed)
[facility] Emergency Department Provider Note  ____________________________________________   First MD Initiated Contact with Patient 12/30/16 1137     (approximate)  I have reviewed the triage vital signs and the nursing notes.   HISTORY  Chief Complaint Hand Pain    HPI Kathleen Rush is a 41 y.o. female is here complaining of bilateral thumb pain. Patient states that she had carpal tunnel surgery back in 2016.  Patient states that a couple months ago her problems started "locking up". Patient has not called to make an appointment with the orthopedist who did her carpal tunnel surgery because she states her Medicaid ran out.   Past Medical History:  Diagnosis Date  . Anemia   . Anxiety   . Eczema   . Hypertension   . Syncopal episodes OCT 2015   PT STATES SHE IS TOLD SHE PASSES OUT BUT THINKS SHE HAS SEIZURES?    Patient Active Problem List   Diagnosis Date Noted  . Allergic rhinitis 07/18/2016  . Medication monitoring encounter 01/22/2016  . Eczema 09/03/2015  . IUD contraception 09/02/2015  . Lumbar back pain with radiculopathy affecting right lower extremity 07/06/2015  . Microcytic anemia 07/06/2015  . Iron deficiency anemia due to chronic blood loss 02/23/2015  . Morbid obesity (HCC) 02/18/2015  . Screening for tuberculosis 12/15/2014  . Anxiety   . Hypertension     Past Surgical History:  Procedure Laterality Date  . BILATERAL CARPAL TUNNEL RELEASE Bilateral 10/14/2014   Procedure: BILATERAL CARPAL TUNNEL RELEASE;  Surgeon: Myra Rudehristopher Smith, MD;  Location: [facility] ORS;  Service: Orthopedics;  Laterality: Bilateral;  . CESAREAN SECTION    . TUBAL LIGATION      Prior to Admission medications   Medication Sig Start Date End Date Taking? Authorizing Provider  amLODipine (NORVASC) 5 MG tablet Take 1 tablet (5 mg total) by mouth daily. 07/15/16   Kerman PasseyLada, Melinda P, MD  DULoxetine (CYMBALTA) 60 MG capsule Take 1 capsule (60 mg total) by  mouth daily. 07/15/16   Lada, Janit BernMelinda P, MD  ELIDEL 1 % cream APP AA OF FACE BID UNTIL CLEAR 10/19/15   [provider]  fexofenadine (ALLEGRA ALLERGY) 180 MG tablet Take 1 tablet (180 mg total) by mouth daily. If needed for allergies 07/15/16   Lada, Janit BernMelinda P, MD  fluticasone (FLONASE) 50 MCG/ACT nasal spray Place 2 sprays into both nostrils daily. 07/15/16   Kerman PasseyLada, Melinda P, MD  hydrALAZINE (APRESOLINE) 50 MG tablet Take 1 tablet (50 mg total) by mouth 3 (three) times daily. 01/22/16   Kerman PasseyLada, Melinda P, MD  HYDROcodone-acetaminophen (NORCO/VICODIN) 5-325 MG tablet Take 1 tablet by mouth every 6 (six) hours as needed for moderate pain. 08/02/16   Kerman PasseyLada, Melinda P, MD  lisinopril-hydrochlorothiazide (PRINZIDE,ZESTORETIC) 20-12.5 MG tablet Take 2 tablets by mouth every morning. 12/15/15   Lada, Janit BernMelinda P, MD  medroxyPROGESTERone (DEPO-PROVERA) 150 MG/ML injection Inject 1 mL (150 mg total) into the muscle every 3 (three) months. 05/17/16   Anyanwu, Jethro BastosUgonna A, MD  megestrol (MEGACE) 40 MG tablet Take 40 mg by mouth 2 (two) times daily.    [provider]  methocarbamol (ROBAXIN) 500 MG tablet Take 1-2 tablets (500-1,000 mg total) by mouth every 6 (six) hours as needed for muscle spasms. 02/18/16   Kerman PasseyLada, Melinda P, MD  metoprolol succinate (TOPROL-XL) 50 MG 24 hr tablet Take 1 tablet (50 mg total) by mouth daily. Take with or immediately following a meal. 12/15/15   Lada, Janit BernMelinda P, MD  metroNIDAZOLE (FLAGYL) 500 MG tablet Take 1 tablet (500 mg total) by mouth 2 (two) times daily. 07/29/16   Noorvik Bing, MD  naproxen (NAPROSYN) 500 MG tablet Take 1 tablet (500 mg total) by mouth 2 (two) times daily with a meal. 12/30/16   Bridget Hartshorn L, PA-C  triamcinolone ointment (KENALOG) 0.1 % Apply 1 application topically every morning. Apply for eczema 06/05/14   [provider]    Allergies Patient has no known allergies.  Family History  Problem Relation Age of Onset  . Hypertension  Father   . Throat cancer Father   . Hypertension Mother   . Diabetes Paternal Grandfather   . Prostate cancer Paternal Grandfather   . Diabetes Paternal Grandmother   . Throat cancer Paternal Grandmother   . Diabetes Maternal Grandmother   . Diabetes Maternal Grandfather   . Prostate cancer Maternal Grandfather   . Heart disease Neg Hx   . Stroke Neg Hx   . COPD Neg Hx     Social History Social History  Substance Use Topics  . Smoking status: Former Smoker    Types: Cigarettes    Quit date: 09/24/2015  . Smokeless tobacco: Never Used  . Alcohol use 0.6 oz/week    1 Glasses of wine per week     Comment: occasional beer    Review of Systems Constitutional: No fever/chills Cardiovascular: Denies chest pain. Respiratory: Denies shortness of breath. Musculoskeletal: Positive bilateral thumb pain. Skin: Negative for rash. Neurological: Negative for headaches, focal weakness or numbness. ____________________________________________   PHYSICAL EXAM:  VITAL SIGNS: ED Triage Vitals  Enc Vitals Group     BP      Pulse      Resp      Temp      Temp src      SpO2      Weight      Height      Head Circumference      Peak Flow      Pain Score      Pain Loc      Pain Edu?      Excl. in GC?    Constitutional: Alert and oriented. Well appearing and in no acute distress. Eyes: Conjunctivae are normal.  Head: Atraumatic. Neck: No stridor.  Cardiovascular: Normal rate, regular rhythm. Grossly normal heart sounds.  Good peripheral circulation. Respiratory: Normal respiratory effort.  No retractions. Lungs CTAB. Musculoskeletal: Examination of the gums are noted gross deformity and no soft tissue swelling present.  Capillary refills less than 3 seconds. Patient is able to flex her thumbs bilaterally are able to flex her patient on extension of her distal phalanx remains in flexion and does not extend immediately. Patient was able to pop both DIP joints without manually moving  her thumb. Neurologic:  Normal speech and language. No gross focal neurologic deficits are appreciated.  Skin:  Skin is warm, dry and intact.  Psychiatric: Mood and affect are normal. Speech and behavior are normal.  ____________________________________________   LABS (all labs ordered are listed, but only abnormal results are displayed)  Labs Reviewed - No data to display  PROCEDURES  Procedure(s) performed: None  Procedures  Critical Care performed: No  ____________________________________________   INITIAL IMPRESSION / ASSESSMENT AND PLAN / ED COURSE  Pertinent labs & imaging results that were available during my care of the patient were reviewed by me and considered in my medical decision making (see chart for details).  Patient is given prescriptions for naproxen  500 mg twice a day with food. She is to take this for inflammation and pain. She is instructed to follow-up with the orthopedist on call who is Dr. Martha Clan for a evaluation of her trigger thumbs.   ___________________________________________   FINAL CLINICAL IMPRESSION(S) / ED DIAGNOSES  Final diagnoses:  Trigger finger of left thumb  Trigger thumb of right hand      NEW MEDICATIONS STARTED DURING THIS VISIT:  Discharge Medication List as of 12/30/2016 12:24 PM    START taking these medications   Details  naproxen (NAPROSYN) 500 MG tablet Take 1 tablet (500 mg total) by mouth 2 (two) times daily with a meal., Starting Fri 12/30/2016, Print         Note:  This document was prepared using Dragon voice recognition software and may include unintentional dictation errors.    Tommi Rumps, PA-C 12/30/16 1240    Loleta Rose, MD 12/30/16 [phone]

## 2016-12-30 NOTE — ED Notes (Signed)
Pt states "my thumbs are locking." Appears in no distress.

## 2016-12-30 NOTE — Discharge Instructions (Signed)
You will need to follow up with orthopedics for your trigger thumbs. Naproxen 500 mg twice a day with food.

## 2016-12-30 NOTE — ED Triage Notes (Signed)
Presents with bilateral thumb pain  States she had carpel tunnel surgery to hands in past  Now states she feels like her thumbs area locking up on her

## 2017-02-09 ENCOUNTER — Ambulatory Visit (INDEPENDENT_AMBULATORY_CARE_PROVIDER_SITE_OTHER): Payer: Self-pay

## 2017-02-09 DIAGNOSIS — Z3042 Encounter for surveillance of injectable contraceptive: Secondary | ICD-10-CM

## 2017-02-09 NOTE — Progress Notes (Addendum)
Patient presented to the office today with her  self supplied depo-provera injection. Patient received injection in left arm. Patient tolerated well and will follow up in three months for her next injection.

## 2017-02-10 NOTE — Progress Notes (Signed)
Patient seen and assessed by nursing staff.  Agree with documentation and plan.  

## 2017-02-23 ENCOUNTER — Telehealth: Payer: Self-pay | Admitting: *Deleted

## 2017-02-23 DIAGNOSIS — K649 Unspecified hemorrhoids: Secondary | ICD-10-CM

## 2017-02-23 MED ORDER — HYDROCORTISONE 2.5 % RE CREA: 1.0000 "application " | TOPICAL_CREAM | Freq: Two times a day (BID) | RECTAL | 0 refills | Status: AC

## 2017-02-23 NOTE — Telephone Encounter (Signed)
Pt called in stating she has hemorrhoids and nothing OTC has helped. She requested rectal cream. Sent to her pharmacy and advised she needs annual exam appt before any further depo Rx. Pt transferred to front desk to schedule.

## 2017-02-28 ENCOUNTER — Ambulatory Visit: Payer: Self-pay | Admitting: Obstetrics & Gynecology

## 2017-05-18 ENCOUNTER — Ambulatory Visit: Payer: Medicaid Other | Admitting: Obstetrics & Gynecology
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
